# Patient Record
Sex: Male | Born: 1965 | Race: White | Hispanic: No | Marital: Married | Smoking: Former smoker
Health system: Southern US, Community
[De-identification: ages and names within clinical notes are randomized; demographics above are authoritative.]

## PROBLEM LIST (undated history)

## (undated) DIAGNOSIS — I10 Essential (primary) hypertension: Secondary | ICD-10-CM

## (undated) DIAGNOSIS — R7989 Other specified abnormal findings of blood chemistry: Secondary | ICD-10-CM

## (undated) DIAGNOSIS — K219 Gastro-esophageal reflux disease without esophagitis: Secondary | ICD-10-CM

## (undated) DIAGNOSIS — E119 Type 2 diabetes mellitus without complications: Secondary | ICD-10-CM

## (undated) DIAGNOSIS — E7800 Pure hypercholesterolemia, unspecified: Secondary | ICD-10-CM

## (undated) DIAGNOSIS — G4730 Sleep apnea, unspecified: Secondary | ICD-10-CM

## (undated) DIAGNOSIS — R12 Heartburn: Secondary | ICD-10-CM

## (undated) HISTORY — PX: COLONOSCOPY: SHX174

## (undated) HISTORY — PX: MULTIPLE TOOTH EXTRACTIONS: SHX2053

## (undated) HISTORY — PX: ANKLE ARTHROSCOPY: SUR85

## (undated) HISTORY — PX: DG GREAT TOE LEFT FOOT: HXRAD1656

## (undated) HISTORY — PX: BACK SURGERY: SHX140

---

## 2012-10-30 ENCOUNTER — Ambulatory Visit (INDEPENDENT_AMBULATORY_CARE_PROVIDER_SITE_OTHER): Payer: BC Managed Care – PPO | Admitting: Family Medicine

## 2012-10-30 VITALS — BP 131/90 | Ht 69.0 in | Wt 190.0 lb

## 2012-10-30 DIAGNOSIS — M722 Plantar fascial fibromatosis: Secondary | ICD-10-CM

## 2012-10-30 NOTE — Progress Notes (Signed)
Tanner Martinez is a 47 y.o. male who presents to [facility] today for right plantar fasciitis present for 6 months. Patient has previously been seen by his primary care provider who obtained x-rays showing mild he'll spurring. He has had a corticosteroid injection which was not effective. He previously was a runner running one half marathons frequently. In the last 6 months he has been unable to run due to heel pain. He notes right heel pain worse with activity and better with rest. He notes the pain is present with the first step of the day. He has tried some over-the-counter orthotic insoles which have been somewhat helpful.  No radiating pain weakness numbness fevers or chills   PMH reviewed. Healthy otherwise History  Substance Use Topics  . Smoking status: Not on file  . Smokeless tobacco: Not on file  . Alcohol Use: Not on file   retired ROS as above otherwise neg   Exam:  BP 131/90  Ht 5\' 9"  (1.753 m)  Wt 190 lb (86.183 kg)  BMI 28.05 kg/m2 Gen: Well NAD MSK: Right foot Normal-appearing Tender palpation over the medial plantar calcaneus Normal motion capillary refill sensation and strength Normal gait

## 2012-10-30 NOTE — Assessment & Plan Note (Signed)
Plantar fasciitis Plan: Ice massage Eccentric calf exercises Arch straps Heel cups Followup in 6 weeks

## 2012-10-30 NOTE — Patient Instructions (Addendum)
Thank you for coming in today. You have plantar fasciitis.  THis is a nagging problem that will take a while to get better.  1) Toe pulled back ice massage 2) Eccentic heel lifts 3 sets of 30 twice a day. Remember to go down slowly.  3) Grass barefoot walking.  4) Heel cups and arch straps.   Return in 6 weeks.

## 2012-11-25 ENCOUNTER — Ambulatory Visit: Payer: Self-pay | Admitting: Sports Medicine

## 2012-12-11 ENCOUNTER — Ambulatory Visit: Payer: Self-pay | Admitting: Family Medicine

## 2012-12-11 ENCOUNTER — Ambulatory Visit (INDEPENDENT_AMBULATORY_CARE_PROVIDER_SITE_OTHER): Payer: BC Managed Care – PPO | Admitting: Family Medicine

## 2012-12-11 VITALS — BP 120/70 | Ht 69.0 in | Wt 190.0 lb

## 2012-12-11 DIAGNOSIS — M722 Plantar fascial fibromatosis: Secondary | ICD-10-CM

## 2012-12-11 NOTE — Progress Notes (Signed)
  Subjective:    Patient ID: Tanner Martinez, male    DOB: 07-06-1965, 47 y.o.   MRN: [id number]  HPI  Followup right plantar fasciitis. They placed him in a heel cup and an arch strap both of which she used faithfully without any improvement.  To recap his issues: " right plantar fasciitis present for 6 months. Patient has previously been seen by his primary care provider who obtained x-rays showing mild heel spurring."    He has had a corticosteroid injection which was minimally effective. At the time of the injection he had severe pain and that did improve but the pain never total he went away and he has not been able to return to running.Tanner Martinez He previously was a runner running  half marathons frequently. In the last 6 months he has been unable to run due to heel pain. He notes right heel pain worse with activity and better with rest. He notes the pain is present with the first step of the day. He has tried some over-the-counter orthotic insoles which have been somewhat helpful.  No radiating pain weakness numbness fevers or chills   Review of Systems Denies unusual weight change. Denies foot numbness. Denies any color change of the skin of the feet.    Objective:   Physical Exam  Vital signs are reviewed GENERAL: Well-developed male no acute distress FOOT: Right. Tender to palpation at the origin of the plantar fascia. The medial calcaneal areas also mildly tender to palpation. No ecchymoses. VASCULAR: Dorsalis pedis pulses 2+ bilaterally equal. Skin: No erythema, no deformity, no callus. Normal Refill GAIT: Gait is antalgic today so it limits evaluation but he appears to walk with right toe out. I think she's putting excess strain on his plantar fascia as he proceeds through stance phase.      Assessment & Plan:  Unilateral plantar fasciitis. I'm hesitant to try any other steroid injections of the week a little better handle on the mechanics. Today I placed him in a green sports insult with  a scaphoid pad. I don't think he is getting enough arch support is just strap. I'll see him back in a week or so increased tolerating this okay think it would be prudent to proceed with a second corticosteroid injection to get rid of the chronic inflammation. Ultimately he may need custom molded orthotics.

## 2012-12-17 ENCOUNTER — Ambulatory Visit (INDEPENDENT_AMBULATORY_CARE_PROVIDER_SITE_OTHER): Payer: BC Managed Care – PPO | Admitting: Sports Medicine

## 2012-12-17 VITALS — BP 120/70 | Ht 69.0 in | Wt 193.0 lb

## 2012-12-17 DIAGNOSIS — M722 Plantar fascial fibromatosis: Secondary | ICD-10-CM

## 2012-12-18 NOTE — Progress Notes (Signed)
  Subjective:    Patient ID: Tanner Martinez, male    DOB: [birth year], 47 y.o.   MRN: [id number]  HPI Patient comes in today for followup on right foot plantar fasciitis. Was last seen by Dr. Jennette Kettle at her clinic last week. He was given a pair of green sports insoles with scaphoid pads but found them to be very uncomfortable. He has resumed using his more rigid off-the-shelf orthotic which he finds more comfortable. He is here today for consideration of a cortisone injection. He is leaving for a long trip up the [place] morning. He has had heel pain for several months. Has had 2 injections, the first one 6 months ago and the second one one month ago. First injection was quite helpful but the second one was less effective. He localizes his pain to the heel but at times radiates down the arch. Worse with first step. No swelling. Please see his previous office notes for further details regarding history.    Review of Systems     Objective:   Physical Exam Well-developed, fit-appearing. No acute distress. Awake alert and oriented x3. Vital signs are reviewed  Right foot: Patient has a cavus foot. He is tender to palpation at the calcaneal insertion of the plantar fascia. Negative calcaneal squeeze. No soft tissue swelling. Negative Tinel's over the tarsal tunnel. Good dorsalis pedis and posterior tibial pulses. Walks with a slight limp.  MSK ultrasound of the right heel: Limited scan of the plantar fascia was performed. It is definitely thickened at greater than 0.7cm. insertion shows several hypoechoic areas as well as a spur off the calcaneus. No cortical defect to suggest calcaneus stress fracture.       Assessment & Plan:  1. Right heel pain secondary to plantar fasciitis.  I've agreed to reinject the patient's plantar fascia today. This is his third injection and I explained to him that I would not be comfortable continuing with injections. Injection was performed using a medial  approach after risks and benefits were explained. He tolerated this without difficulty. I've given him a new pair of green sports insoles without the scaphoid pads. These will provide a little more cushioning in the more rigid inserts a he's been wearing. He will continue with icing and plantar fascia stretching as he has been doing. Followup in 4 weeks. He has had x-rays done previously but they are not available for review. If symptoms persist despite today's injection it would not be unreasonable to consider further diagnostic imaging before deciding on further treatment.  Consent obtained and verified. Time-out conducted. Noted no overlying erythema, induration, or other signs of local infection. Skin prepped in a sterile fashion. Topical analgesic spray: Ethyl chloride. Joint: right plantar fascia Needle: 25g 1 1/2 inch Completed without difficulty. Meds: 1cc (40mg ) depomedrol, 3cc 1% xylocaine  Advised to call if fevers/chills, erythema, induration, drainage, or persistent bleeding.

## 2013-01-14 ENCOUNTER — Ambulatory Visit: Payer: BC Managed Care – PPO | Admitting: Sports Medicine

## 2013-01-27 ENCOUNTER — Encounter: Payer: Self-pay | Admitting: Sports Medicine

## 2013-01-27 ENCOUNTER — Ambulatory Visit (INDEPENDENT_AMBULATORY_CARE_PROVIDER_SITE_OTHER): Payer: BC Managed Care – PPO | Admitting: Sports Medicine

## 2013-01-27 VITALS — BP 132/82 | HR 66 | Ht 69.0 in | Wt 193.0 lb

## 2013-01-27 DIAGNOSIS — M722 Plantar fascial fibromatosis: Secondary | ICD-10-CM

## 2013-01-27 NOTE — Progress Notes (Signed)
  Subjective:    Patient ID: Tanner Martinez, male    DOB: 13-Jan-1966, 47 y.o.   MRN: [id number]  HPI Patient comes in today for followup on right foot plantar fasciitis. I injected his plantar fascia during his last office visit a little over a month ago. Overall his symptoms have improved but not completely resolved. He continues to do his stretching but admits that he has not been icing the way he should. His symptoms are most noticeable with prolonged standing and walking. He has removed her green sports insoles as he found them uncomfortable. The symptoms have been present since December. MSK ultrasound performed at the last office visit showed his plantar fascia to be markedly enlarged at 0.7cm.   Review of Systems     Objective:   Physical Exam Well-developed, well-nourished. No acute distress  Right foot: Minimal tenderness to palpation at the calcaneal insertion of the plantar fascia. No soft tissue swelling. Negative calcaneal squeeze. Neurovascularly intact distally. Walking without significant limp.      Assessment & Plan:  Right heel pain secondary to plantar fasciitis  Overall his symptoms are improving. He has had a total of 3 injections. I explained to him that statistically speaking his symptoms are likely to burn out on their own. Right now his symptoms are tolerable. Continue with stretching and I've reinforced the importance of icing. We discussed the possibility of referral to Dr. Lajoyce Corners or Dr Victorino Dike if his symptoms persist. Followup PRN.

## 2013-07-08 ENCOUNTER — Encounter: Payer: Self-pay | Admitting: Sports Medicine

## 2013-07-08 ENCOUNTER — Ambulatory Visit (INDEPENDENT_AMBULATORY_CARE_PROVIDER_SITE_OTHER): Payer: BC Managed Care – PPO | Admitting: Sports Medicine

## 2013-07-08 VITALS — BP 120/80 | HR 72 | Ht 69.0 in | Wt 193.0 lb

## 2013-07-08 DIAGNOSIS — M722 Plantar fascial fibromatosis: Secondary | ICD-10-CM

## 2013-07-09 NOTE — Progress Notes (Signed)
   Subjective:    Patient ID: Tanner Martinez, male    DOB: 06/08/1965, 48 y.o.   MRN: [id number]  HPI Patient comes in today with returning right foot pain. He has a well-documented history of plantar fasciitis in this foot. He's had multiple cortisone injections, last one being this past summer. The injection provided him with positive yet only limited symptom relief. He reinjured his foot while doing some impact type of exercises. His pain is now along the lateral and plantar aspect of his foot. We tried a green sports insole and a scaphoid pad previously but he did not tolerate it. Instead, he was using a more rigid insert which she found more comfortable. Today, he is not wearing any sort of an insert. He is doing his plantar fascial stretches but is not doing any icing. He is here today discuss further treatment. Symptoms have been present now for a little over a year.    Review of Systems     Objective:   Physical Exam Well-developed, well-nourished. No acute distress. Awake alert and oriented x3  Right foot: Cavus foot. Tenderness to palpation along the mid substance of the plantar fascia. Only male tenderness at the calcaneal insertion. Negative calcaneal squeeze. No soft tissue swelling. Neurovascular intact distally. Walking with a slight limp.  MSK ultrasound of the right plantar fascia shows it to be thickened. There is nicotine hypoechoic change throughout the proximal plantar fascia. Calcaneal spurs are seen as well.      Assessment & Plan:  Chronic right foot pain secondary to plantar fasciitis  I discussed the patient's options with him including trying custom orthotics, continuing watchful waiting, and surgical referral. Patient is not interested at this time in anything surgical. I do think we should try to get him into a custom orthotic as he has a rather pronounced cavus foot. He will check with his insurance prior to us instructing knees. Followup appointment for next week.  In the meantime, I've given him a new set of arch straps and I've reiterated the importance of plantar fascial stretching and daily icing.

## 2013-07-15 ENCOUNTER — Ambulatory Visit: Payer: BC Managed Care – PPO | Admitting: Sports Medicine

## 2013-07-22 ENCOUNTER — Encounter: Payer: Self-pay | Admitting: Sports Medicine

## 2013-07-22 ENCOUNTER — Ambulatory Visit (INDEPENDENT_AMBULATORY_CARE_PROVIDER_SITE_OTHER): Payer: BC Managed Care – PPO | Admitting: Sports Medicine

## 2013-07-22 VITALS — BP 128/85 | HR 62 | Ht 69.0 in | Wt 193.0 lb

## 2013-07-22 DIAGNOSIS — M216X9 Other acquired deformities of unspecified foot: Secondary | ICD-10-CM

## 2013-07-22 DIAGNOSIS — M722 Plantar fascial fibromatosis: Secondary | ICD-10-CM

## 2013-07-22 DIAGNOSIS — Q667 Congenital pes cavus, unspecified foot: Secondary | ICD-10-CM

## 2013-07-22 NOTE — Progress Notes (Signed)
Patient ID: Marney DoctorJody Martinez, male   DOB: 07/08/1965, 48 y.o.   MRN: [id number]  Patient comes in today for custom orthotics. He has a well-documented history of chronic plantar fasciitis. Please see previous office notes for details regarding history and physical.  Patient was fitted for a : standard, cushioned, semi-rigid orthotic. The orthotic was heated and afterward the patient stood on the orthotic blank positioned on the orthotic stand. The patient was positioned in subtalar neutral position and 10 degrees of ankle dorsiflexion in a weight bearing stance. After completion of molding, a stable base was applied to the orthotic blank. The blank was ground to a stable position for weight bearing. Size:11 Base: Blue EVA Posting:none Additional orthotic padding:none  Total time spent with the patient was 30 minutes with greater than 50% of that time spent in face to face consultation regarding orthotic evaluation, construction, and fitting. F/U PRN

## 2014-11-23 ENCOUNTER — Encounter: Payer: Self-pay | Admitting: Sports Medicine

## 2014-11-23 ENCOUNTER — Ambulatory Visit (INDEPENDENT_AMBULATORY_CARE_PROVIDER_SITE_OTHER): Payer: BLUE CROSS/BLUE SHIELD | Admitting: Sports Medicine

## 2014-11-23 VITALS — BP 146/104 | Ht 69.0 in | Wt 210.0 lb

## 2014-11-23 DIAGNOSIS — M25572 Pain in left ankle and joints of left foot: Secondary | ICD-10-CM

## 2014-11-23 DIAGNOSIS — S93402A Sprain of unspecified ligament of left ankle, initial encounter: Secondary | ICD-10-CM | POA: Diagnosis not present

## 2014-11-23 DIAGNOSIS — M722 Plantar fascial fibromatosis: Secondary | ICD-10-CM

## 2014-11-24 NOTE — Progress Notes (Signed)
   Subjective:    Patient ID: Tanner Martinez, male    DOB: 03/14/1966, 49 y.o.   MRN: [id number]  HPI chief complaint: Bilateral foot pain and left ankle pain  Very pleasant 49 year old male comes in today complaining of returning bilateral foot pain. He has a history of chronic plantar fasciitis and his pain is identical to what he has experienced previously. He has had intermittent symptoms for about 3 years. He has had exhaustive conservative treatment including cortisone injections, orthotics, night splinting, physical therapy, and medications. He is also complaining of a new injury to the left ankle. He suffered a rather significant inversion injury a few weeks ago while training for half marathon. Was initially seen by a physician and x-rays were negative for fracture per his report. He was initially placed into a splint which he has weaned from himself. He was able to run the half marathon. He has continued to have pain and weakness in his ankle. He localizes all of his pain to the lateral ankle. He has noticed some swelling as well. Denies significant injury to the ankle in the past. He is currently using meloxicam as well as Voltaren gel for some left elbow lateral epicondylitis but it has not improved his foot or ankle pain.  Interim medical history reviewed Medications reviewed No known drug allergies    Review of Systems    as above Objective:   Physical Exam Well-developed, well-nourished. No acute distress.  Patient has slight tenderness to palpation at the origin of the plantar fascia bilaterally. Negative calcaneal squeeze. Cavus foot.  Left ankle: Full range of motion with mild diffuse swelling. Some tenderness to palpation over the ATF. Positive anterior drawer, 2+ talar tilt. No tenderness over the medial malleolus. No tenderness at the base of the fifth metatarsal nor over the navicular. Neurovascular intact distally. Walking without significant limp.       Assessment &  Plan:  Chronic bilateral foot pain secondary to plantar fasciitis Left ankle pain secondary to ankle sprain  Patient's plantar fasciitis has been present for about 3 years. Despite this he is not interested in operative intervention. He would be interested in trying some high-frequency ultrasound which I think has some limited evidence for treatment. I will research this a little further for him and contact him once I have more information. For his left ankle sprain I've given him a comprehensive home exercise program including strengthening and proprioception exercises. Also given him a body helix compression sleeve with activity. I think he can continue with activity as tolerated in regards to both his plantar fasciitis and his left ankle sprain. If his ankle pain persists he will let me know and I would consider further diagnostic imaging.

## 2015-04-18 ENCOUNTER — Encounter: Payer: Self-pay | Admitting: Sports Medicine

## 2015-04-18 ENCOUNTER — Ambulatory Visit (INDEPENDENT_AMBULATORY_CARE_PROVIDER_SITE_OTHER): Payer: BLUE CROSS/BLUE SHIELD | Admitting: Sports Medicine

## 2015-04-18 VITALS — BP 128/70 | Ht 69.0 in | Wt 215.0 lb

## 2015-04-18 DIAGNOSIS — M25572 Pain in left ankle and joints of left foot: Secondary | ICD-10-CM

## 2015-04-18 NOTE — Assessment & Plan Note (Deleted)
Patient with persistent ankle pain. X-rays reviewed and not significant for likely precipitant of pain. Concern for osteochondral defect. Will obtain MRI of left ankle to better evaluate.

## 2015-04-18 NOTE — Progress Notes (Signed)
   Subjective:    Patient ID: Tanner Martinez, male    DOB: 12/07/1965, 49 y.o.   MRN: [id number]  HPI Patient presents for follow-up of his ankle injury. His injury first occurred 8 months ago. Since his last visit 4 months ago, he reports his pain has largely remained unchanged. He has been participating in physical therapy which has not improved his pain much. Walking, running going up/down stairs aggravate his pain. Pain is not constant. He describes the pain as sharp pain that starts on the dorsal aspect of his ankle and radiates medially down his ankle. He reports continued swelling but has not been icing his ankle. He has not re injured his ankle.   Review of Systems  Musculoskeletal:       Ankle pain       Objective:  BP 128/70 mmHg  Ht 5\' 9"  (1.753 m)  Wt 215 lb (97.523 kg)  BMI 31.74 kg/m2   Physical Exam  Constitutional: He appears well-developed.  Left ankle: Limited dorsiflexion of the left ankle by about 50% when compared to the uninvolved right ankle. Full plantar flexion. Good inversion and eversion. Trace effusion. No soft tissue swelling. No tenderness to palpation along the peroneal tendon. No appreciable subluxation of the peroneal tendon. No tenderness to palpation over the distal fibula or medial malleolus. No tenderness along the anterior lateral joint line. Neurovascularly intact distally. Walks with a slight limp.  X-rays from an outside source from May of the sheer are reviewed. No obvious fracture seen. There is a small well-corticated calcification distal to the tibia which may be consistent with an old injury versus a small accessory bone.. There is also a small os trigonum.       Assessment & Plan:  Persistent left ankle pain and swelling status post inversion injury  I'm concerned about a possible OCD versus an occult talus fracture. There is also the possibility of a intra-articular loose body. In order to better differentiate we will need to get an MRI. I  will call him with those results once available at which point we will delineate further treatment.

## 2015-04-24 ENCOUNTER — Ambulatory Visit: Payer: BLUE CROSS/BLUE SHIELD | Admitting: Sports Medicine

## 2015-04-27 ENCOUNTER — Ambulatory Visit
Admission: RE | Admit: 2015-04-27 | Discharge: 2015-04-27 | Disposition: A | Discharge status: Home / Self Care | Payer: BLUE CROSS/BLUE SHIELD | Source: Ambulatory Visit | Attending: Sports Medicine | Admitting: Sports Medicine

## 2015-04-27 DIAGNOSIS — M25572 Pain in left ankle and joints of left foot: Secondary | ICD-10-CM

## 2015-05-02 ENCOUNTER — Telehealth: Payer: Self-pay | Admitting: Sports Medicine

## 2015-05-02 DIAGNOSIS — M25572 Pain in left ankle and joints of left foot: Secondary | ICD-10-CM

## 2015-05-02 NOTE — Telephone Encounter (Signed)
I spoke with the patient on the phone today after reviewing the MRI of his left ankle. He does have a small osteochondral defect with some surrounding marrow edema. He also has synovitis as well as some thickening of an accessory anterior inferior tibiofibular ligament which may result in anterior lateral ankle impingement. Based on this constellation of findings I recommended a referral to Dr. Lajoyce Cornersuda to discuss further treatment. Patient is in agreement with this plan. I will defer further workup and treatment to his discretion and the patient will follow-up with me as needed.

## 2015-05-02 NOTE — Telephone Encounter (Signed)
[facility] Dr Aldean BakerMarcus Duda Friday December 16th at 9a [address], [place], [place] [address] Phone: [phone]

## 2015-05-02 NOTE — Addendum Note (Signed)
Addended by: Annita BrodMOORE, Roshawn Lacina C on: 05/02/2015 04:13 PM   Modules accepted: Orders

## 2015-06-29 ENCOUNTER — Encounter (HOSPITAL_COMMUNITY): Payer: Self-pay | Admitting: *Deleted

## 2015-06-29 ENCOUNTER — Other Ambulatory Visit (HOSPITAL_COMMUNITY): Payer: Self-pay | Admitting: Orthopedic Surgery

## 2015-06-29 NOTE — Progress Notes (Signed)
Pt denies SOB, chest pain, and being under the care of a cardiologist. Pt denies having a stress test, echo and cardiac cath. Pt denies having a chest x ray and EKG within the last year. Pt made aware to stop  taking Aspirin, vitamins, fish oil and herbal medications. Do not take any NSAIDs ie: Ibuprofen, Advil, Naproxen or any medication containing Aspirin such as Voltaren and Mobic. Pt made aware of diabetes protocol to check BS in the AM, interventions for BS < 70 >220 and to call SS morning of procedure with any issues. Pt was instructed to arrive at 12:45 however, pt stated that he was instructed by MD's office to arrive at 12:00 noon. Pt verbalized understanding of all pre-op instructions.

## 2015-06-30 ENCOUNTER — Ambulatory Visit (HOSPITAL_COMMUNITY): Payer: BLUE CROSS/BLUE SHIELD | Admitting: Anesthesiology

## 2015-06-30 ENCOUNTER — Encounter (HOSPITAL_COMMUNITY)
Admission: RE | Disposition: A | Discharge status: Home / Self Care | Payer: Self-pay | Source: Ambulatory Visit | Attending: Orthopedic Surgery

## 2015-06-30 ENCOUNTER — Encounter (HOSPITAL_COMMUNITY): Payer: Self-pay

## 2015-06-30 ENCOUNTER — Ambulatory Visit (HOSPITAL_COMMUNITY)
Admission: RE | Admit: 2015-06-30 | Discharge: 2015-06-30 | Disposition: A | Discharge status: Home / Self Care | Payer: BLUE CROSS/BLUE SHIELD | Source: Ambulatory Visit | Attending: Orthopedic Surgery | Admitting: Orthopedic Surgery

## 2015-06-30 DIAGNOSIS — E119 Type 2 diabetes mellitus without complications: Secondary | ICD-10-CM | POA: Insufficient documentation

## 2015-06-30 DIAGNOSIS — G4733 Obstructive sleep apnea (adult) (pediatric): Secondary | ICD-10-CM | POA: Diagnosis not present

## 2015-06-30 DIAGNOSIS — M669 Spontaneous rupture of unspecified tendon: Secondary | ICD-10-CM

## 2015-06-30 DIAGNOSIS — M6620 Spontaneous rupture of extensor tendons, unspecified site: Secondary | ICD-10-CM

## 2015-06-30 DIAGNOSIS — X58XXXA Exposure to other specified factors, initial encounter: Secondary | ICD-10-CM | POA: Diagnosis not present

## 2015-06-30 DIAGNOSIS — I10 Essential (primary) hypertension: Secondary | ICD-10-CM | POA: Insufficient documentation

## 2015-06-30 DIAGNOSIS — S96112A Strain of muscle and tendon of long extensor muscle of toe at ankle and foot level, left foot, initial encounter: Secondary | ICD-10-CM | POA: Insufficient documentation

## 2015-06-30 DIAGNOSIS — Z87891 Personal history of nicotine dependence: Secondary | ICD-10-CM | POA: Diagnosis not present

## 2015-06-30 HISTORY — DX: Essential (primary) hypertension: I10

## 2015-06-30 HISTORY — PX: REPAIR EXTENSOR TENDON: SHX5382

## 2015-06-30 HISTORY — DX: Type 2 diabetes mellitus without complications: E11.9

## 2015-06-30 HISTORY — DX: Heartburn: R12

## 2015-06-30 HISTORY — DX: Sleep apnea, unspecified: G47.30

## 2015-06-30 HISTORY — DX: Pure hypercholesterolemia, unspecified: E78.00

## 2015-06-30 LAB — CBC
HEMATOCRIT: 44.9 % (ref 39.0–52.0)
HEMOGLOBIN: 15.5 g/dL (ref 13.0–17.0)
MCH: 32.1 pg (ref 26.0–34.0)
MCHC: 34.5 g/dL (ref 30.0–36.0)
MCV: 93 fL (ref 78.0–100.0)
Platelets: 205 10*3/uL (ref 150–400)
RBC: 4.83 MIL/uL (ref 4.22–5.81)
RDW: 12.6 % (ref 11.5–15.5)
WBC: 5.8 10*3/uL (ref 4.0–10.5)

## 2015-06-30 LAB — APTT: aPTT: 26 seconds (ref 24–37)

## 2015-06-30 LAB — COMPREHENSIVE METABOLIC PANEL
ALBUMIN: 3.6 g/dL (ref 3.5–5.0)
ALT: 57 U/L (ref 17–63)
ANION GAP: 9 (ref 5–15)
AST: 33 U/L (ref 15–41)
Alkaline Phosphatase: 62 U/L (ref 38–126)
BUN: 13 mg/dL (ref 6–20)
CHLORIDE: 107 mmol/L (ref 101–111)
CO2: 23 mmol/L (ref 22–32)
Calcium: 9 mg/dL (ref 8.9–10.3)
Creatinine, Ser: 0.87 mg/dL (ref 0.61–1.24)
GFR calc Af Amer: >60 mL/min (ref >60)
Glucose, Bld: 138 mg/dL — ABNORMAL HIGH (ref 65–99)
POTASSIUM: 4.1 mmol/L (ref 3.5–5.1)
Sodium: 139 mmol/L (ref 135–145)
Total Bilirubin: 0.6 mg/dL (ref 0.3–1.2)
Total Protein: 6.9 g/dL (ref 6.5–8.1)

## 2015-06-30 LAB — GLUCOSE, CAPILLARY
GLUCOSE-CAPILLARY: 132 mg/dL — AB (ref 65–99)
Glucose-Capillary: 128 mg/dL — ABNORMAL HIGH (ref 65–99)

## 2015-06-30 LAB — PROTIME-INR
INR: 1.06 (ref 0.00–1.49)
PROTHROMBIN TIME: 14 s (ref 11.6–15.2)

## 2015-06-30 SURGERY — REPAIR, TENDON, EXTENSOR
Anesthesia: General | Laterality: Left

## 2015-06-30 MED ORDER — ONDANSETRON HCL 4 MG/2ML IJ SOLN
INTRAMUSCULAR | Status: DC | PRN
  Administered 2015-06-30: 4 mg via INTRAVENOUS

## 2015-06-30 MED ORDER — HYDROMORPHONE HCL 1 MG/ML IJ SOLN
0.2500 mg | INTRAMUSCULAR | Status: DC | PRN
  Administered 2015-06-30 (×4): 0.5 mg via INTRAVENOUS

## 2015-06-30 MED ORDER — PROPOFOL 10 MG/ML IV BOLUS
INTRAVENOUS | Status: AC
  Filled 2015-06-30: qty 20

## 2015-06-30 MED ORDER — HYDROMORPHONE HCL 1 MG/ML IJ SOLN
INTRAMUSCULAR | Status: AC
  Filled 2015-06-30: qty 1

## 2015-06-30 MED ORDER — LACTATED RINGERS IV SOLN
INTRAVENOUS | Status: DC
  Administered 2015-06-30: 13:00:00 via INTRAVENOUS

## 2015-06-30 MED ORDER — FENTANYL CITRATE (PF) 250 MCG/5ML IJ SOLN
INTRAMUSCULAR | Status: AC
  Filled 2015-06-30: qty 5

## 2015-06-30 MED ORDER — ONDANSETRON HCL 4 MG/2ML IJ SOLN
INTRAMUSCULAR | Status: AC
  Filled 2015-06-30: qty 4

## 2015-06-30 MED ORDER — CEFAZOLIN SODIUM-DEXTROSE 2-3 GM-% IV SOLR
2.0000 g | INTRAVENOUS | Status: AC
  Administered 2015-06-30: 2 g via INTRAVENOUS
  Filled 2015-06-30: qty 50

## 2015-06-30 MED ORDER — 0.9 % SODIUM CHLORIDE (POUR BTL) OPTIME
TOPICAL | Status: DC | PRN
  Administered 2015-06-30: 1000 mL

## 2015-06-30 MED ORDER — PROMETHAZINE HCL 25 MG/ML IJ SOLN: 6.2500 mg | INTRAMUSCULAR | Status: DC | PRN

## 2015-06-30 MED ORDER — LIDOCAINE HCL (CARDIAC) 20 MG/ML IV SOLN
INTRAVENOUS | Status: DC | PRN
  Administered 2015-06-30: 100 mg via INTRAVENOUS

## 2015-06-30 MED ORDER — PROPOFOL 10 MG/ML IV BOLUS
INTRAVENOUS | Status: DC | PRN
  Administered 2015-06-30: 200 mg via INTRAVENOUS

## 2015-06-30 MED ORDER — MIDAZOLAM HCL 5 MG/5ML IJ SOLN
INTRAMUSCULAR | Status: DC | PRN
  Administered 2015-06-30: 2 mg via INTRAVENOUS

## 2015-06-30 MED ORDER — CHLORHEXIDINE GLUCONATE 4 % EX LIQD: 60.0000 mL | Freq: Once | CUTANEOUS | Status: DC

## 2015-06-30 MED ORDER — MIDAZOLAM HCL 2 MG/2ML IJ SOLN
INTRAMUSCULAR | Status: AC
  Filled 2015-06-30: qty 2

## 2015-06-30 MED ORDER — FENTANYL CITRATE (PF) 100 MCG/2ML IJ SOLN
INTRAMUSCULAR | Status: DC | PRN
  Administered 2015-06-30: 100 ug via INTRAVENOUS
  Administered 2015-06-30: 50 ug via INTRAVENOUS
  Administered 2015-06-30: 100 ug via INTRAVENOUS
  Administered 2015-06-30 (×2): 50 ug via INTRAVENOUS
  Administered 2015-06-30: 150 ug via INTRAVENOUS

## 2015-06-30 SURGICAL SUPPLY — 46 items
BLADE SURG 10 STRL SS (BLADE) ×2 IMPLANT
BNDG COHESIVE 4X5 TAN STRL (GAUZE/BANDAGES/DRESSINGS) ×2 IMPLANT
BNDG COHESIVE 6X5 TAN STRL LF (GAUZE/BANDAGES/DRESSINGS) ×4 IMPLANT
BNDG ESMARK 4X9 LF (GAUZE/BANDAGES/DRESSINGS) ×2 IMPLANT
BNDG GAUZE STRTCH 6 (GAUZE/BANDAGES/DRESSINGS) ×2 IMPLANT
COTTON STERILE ROLL (GAUZE/BANDAGES/DRESSINGS) ×2 IMPLANT
COVER SURGICAL LIGHT HANDLE (MISCELLANEOUS) ×2 IMPLANT
CUFF TOURNIQUET SINGLE 34IN LL (TOURNIQUET CUFF) IMPLANT
CUFF TOURNIQUET SINGLE 44IN (TOURNIQUET CUFF) IMPLANT
DRAPE INCISE IOBAN 66X45 STRL (DRAPES) ×2 IMPLANT
DRAPE U-SHAPE 47X51 STRL (DRAPES) ×2 IMPLANT
DRSG ADAPTIC 3X8 NADH LF (GAUZE/BANDAGES/DRESSINGS) ×2 IMPLANT
DRSG KUZMA FLUFF (GAUZE/BANDAGES/DRESSINGS) ×2 IMPLANT
DRSG PAD ABDOMINAL 8X10 ST (GAUZE/BANDAGES/DRESSINGS) ×4 IMPLANT
DURAPREP 26ML APPLICATOR (WOUND CARE) ×2 IMPLANT
ELECT REM PT RETURN 9FT ADLT (ELECTROSURGICAL) ×2
ELECTRODE REM PT RTRN 9FT ADLT (ELECTROSURGICAL) ×1 IMPLANT
GAUZE SPONGE 4X4 12PLY STRL (GAUZE/BANDAGES/DRESSINGS) ×2 IMPLANT
GLOVE BIOGEL PI IND STRL 9 (GLOVE) ×1 IMPLANT
GLOVE BIOGEL PI INDICATOR 9 (GLOVE) ×1
GLOVE SURG ORTHO 9.0 STRL STRW (GLOVE) ×2 IMPLANT
GOWN STRL REUS W/ TWL XL LVL3 (GOWN DISPOSABLE) ×3 IMPLANT
GOWN STRL REUS W/TWL XL LVL3 (GOWN DISPOSABLE) ×3
KIT ROOM TURNOVER OR (KITS) ×2 IMPLANT
MANIFOLD NEPTUNE II (INSTRUMENTS) IMPLANT
NDL SUT .5 MAYO 1.404X.05X (NEEDLE) ×1 IMPLANT
NEEDLE MAYO TAPER (NEEDLE) ×1
NS IRRIG 1000ML POUR BTL (IV SOLUTION) ×2 IMPLANT
PACK ORTHO EXTREMITY (CUSTOM PROCEDURE TRAY) ×2 IMPLANT
PAD ARMBOARD 7.5X6 YLW CONV (MISCELLANEOUS) IMPLANT
SPONGE GAUZE 4X4 12PLY STER LF (GAUZE/BANDAGES/DRESSINGS) ×4 IMPLANT
SPONGE LAP 4X18 X RAY DECT (DISPOSABLE) IMPLANT
SUCTION FRAZIER HANDLE 10FR (MISCELLANEOUS) ×1
SUCTION TUBE FRAZIER 10FR DISP (MISCELLANEOUS) ×1 IMPLANT
SUT ETHILON 3 0 FSLX (SUTURE) ×2 IMPLANT
SUT FIBERWIRE #2 38 T-5 BLUE (SUTURE) ×4
SUT MNCRL AB 3-0 PS2 18 (SUTURE) IMPLANT
SUT VIC AB 2-0 CT1 27 (SUTURE) ×2
SUT VIC AB 2-0 CT1 TAPERPNT 27 (SUTURE) ×2 IMPLANT
SUTURE FIBERWR #2 38 T-5 BLUE (SUTURE) ×2 IMPLANT
TOWEL OR 17X24 6PK STRL BLUE (TOWEL DISPOSABLE) ×2 IMPLANT
TOWEL OR 17X26 10 PK STRL BLUE (TOWEL DISPOSABLE) ×2 IMPLANT
TRACKER EX WALKER, CLOSED HEEL, WIDE FOOT PLATE, LARGE ×2 IMPLANT
TUBE CONNECTING 12X1/4 (SUCTIONS) ×4 IMPLANT
WATER STERILE IRR 1000ML POUR (IV SOLUTION) IMPLANT
YANKAUER SUCT BULB TIP NO VENT (SUCTIONS) ×2 IMPLANT

## 2015-06-30 NOTE — Op Note (Signed)
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    The risks benefits and alternatives were discussed with the patient preoperatively including but not limited to the risks of infection, bleeding, nerve injury, cardiopulmonary complications, the need for revision surgery, among others, and the patient was willing to proceed.  OPERATIVE IMPLANTS: #2 FiberWire  OPERATIVE FINDINGS: Chronic retracted EHL tendon  OPERATIVE PROCEDURE: Patient brought the operating room and underwent a general anesthetic. After adequate levels anesthesia obtained patient's left lower extremity was prepped using DuraPrep draped into a sterile field a timeout was called. A dorsal incision was made over the retinaculum of the ankle. Blunt dissection was carried down to the retinaculum which was incised. The most distal aspect of the tendon for the EHL was identified the aspect of the tendon that was adjacent to the capsule  was intact the most dorsal aspect of the tendon showed degenerative changes. The tendon was approximately 1 cm away from both the anterior medial and anterior lateral portals did not appear to be injured by the portal location and did not appear to be injured by the debridement within the capsule since the capsule side of the tendon was intact. The most proximal aspect of the EHL tendon was identified and this had retracted. The end of the tendon was debrided. Using a #2 FiberWire this was woven with a Kessler technique to the proximal and distal stumps  and the tendon was repaired this required the great toe to be extended. The wound was irrigated with normal saline. The skin was closed using 2-0 nylon a sterile compressive dressing was applied. Patient was placed in a fracture boot with padding under the great toe to protect the repair. The repair was reinforced with 4 sutures proximally and 4 sutures distally crossing the repair site. Patient was extubated taken to the PACU in stable condition.

## 2015-06-30 NOTE — Anesthesia Procedure Notes (Signed)
Procedure Name: LMA Insertion Date/Time: 06/30/2015 3:17 PM Performed by: Charm Barges, Aneliese Beaudry R Pre-anesthesia Checklist: Patient identified, Emergency Drugs available, Suction available, Patient being monitored and Timeout performed Patient Re-evaluated:Patient Re-evaluated prior to inductionOxygen Delivery Method: Circle system utilized Preoxygenation: Pre-oxygenation with 100% oxygen Intubation Type: IV induction Ventilation: Mask ventilation without difficulty LMA: LMA inserted LMA Size: 5.0 Number of attempts: 1 Placement Confirmation: positive ETCO2 Tube secured with: Tape Dental Injury: Teeth and Oropharynx as per pre-operative assessment

## 2015-06-30 NOTE — Transfer of Care (Signed)
Immediate Anesthesia Transfer of Care Note  Patient: Tanner Martinez  Procedure(s) Performed: Procedure(s): Repair Extensor Hallucis Longus Left Great Toe (Left)  Patient Location: PACU  Anesthesia Type:General  Level of Consciousness: awake, oriented and patient cooperative  Airway & Oxygen Therapy: Patient Spontanous Breathing and Patient connected to nasal cannula oxygen  Post-op Assessment: Report given to RN, Post -op Vital signs reviewed and stable and Patient moving all extremities  Post vital signs: Reviewed and stable  Last Vitals:  Filed Vitals:   06/30/15 1225  BP: 131/97  Pulse: 71  Temp: 36.8 C  Resp: 18    Complications: No apparent anesthesia complications

## 2015-06-30 NOTE — Progress Notes (Signed)
Orthopedic Tech Progress Note Patient Details:  Tanner Martinez 24-Feb-1966 [id number] To be applied in or Ortho Devices Type of Ortho Device: CAM walker Ortho Device/Splint Interventions: Casandra Doffing 06/30/2015, 4:15 PM

## 2015-06-30 NOTE — Anesthesia Postprocedure Evaluation (Signed)
Anesthesia Post Note  Patient: Tanner Martinez  Procedure(s) Performed: Procedure(s) (LRB): Repair Extensor Hallucis Longus Left Great Toe (Left)  Patient location during evaluation: PACU Anesthesia Type: General Level of consciousness: awake and alert Pain management: pain level controlled Vital Signs Assessment: post-procedure vital signs reviewed and stable Respiratory status: spontaneous breathing, nonlabored ventilation, respiratory function stable and patient connected to nasal cannula oxygen Cardiovascular status: blood pressure returned to baseline and stable Postop Assessment: no signs of nausea or vomiting Anesthetic complications: no    Last Vitals:  Filed Vitals:   06/30/15 1657 06/30/15 1700  BP: 124/92   Pulse: 90 88  Temp:    Resp: 14 14    Last Pain:  Filed Vitals:   06/30/15 1706  PainSc: 7                  Daylah Sayavong L

## 2015-06-30 NOTE — H&P (Signed)
Tanner Martinez is an 50 y.o. male.   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 Postoperatively patient has had difficulty with EHL function. We'll plan for exploration debridement of adhesions possible repair of the EHL tendon.  Past Medical History  Diagnosis Date  . Diabetes mellitus without complication (HCC)     diet controlled  . Hypertension     past medical history ; no longer on medications  . Hypercholesterolemia   . Sleep apnea     " slight case don't wear CPAP or Bipap"  . Heartburn     Past Surgical History  Procedure Laterality Date  . Back surgery    . Ankle arthroscopy    . Multiple tooth extractions      Family History  Problem Relation Age of Onset  . Diabetes Mother   . Diabetes Sister    Social History:  reports that he quit smoking about 13 years ago. His smoking use included Cigarettes. He quit smokeless tobacco use about 9 years ago. He reports that he drinks alcohol. He reports that he does not use illicit drugs.  Allergies: No Known Allergies  No prescriptions prior to admission    No results found for this or any previous visit (from the past 48 hour(s)). No results found.  Review of Systems  All other systems reviewed and are negative.   There were no vitals taken for this visit. Physical Exam  On examination patient does not have active extension of his left great toe. Palpation shows laxity of the extensor hallucis longus tendon. 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  Plan: We will plan for debridement of adhesions possible reconstruction of the EHL tendon. Risk and benefits were discussed patient states he understands wishes to proceed at this time.  Nadara Mustard, MD 06/30/2015, 7:05 AM

## 2015-06-30 NOTE — Anesthesia Preprocedure Evaluation (Addendum)
Anesthesia Evaluation  Patient identified by MRN, date of birth, ID band Patient awake    Reviewed: Allergy & Precautions, H&P , NPO status , Patient's Chart, lab work & pertinent test results, reviewed documented beta blocker date and time   Airway Mallampati: II  TM Distance: >3 FB Neck ROM: Full    Dental no notable dental hx. (+) Dental Advisory Given, Teeth Intact   Pulmonary sleep apnea , former smoker,  Mild OSA   Pulmonary exam normal breath sounds clear to auscultation       Cardiovascular Exercise Tolerance: Good hypertension, Pt. on medications negative cardio ROS Normal cardiovascular exam Rhythm:Regular Rate:Normal     Neuro/Psych negative neurological ROS  negative psych ROS   GI/Hepatic negative GI ROS, Neg liver ROS,   Endo/Other  diabetes, Well Controlled, Type 2Diet controlled DM  Renal/GU negative Renal ROS  negative genitourinary   Musculoskeletal negative musculoskeletal ROS (+)   Abdominal   Peds negative pediatric ROS (+)  Hematology negative hematology ROS (+)   Anesthesia Other Findings   Reproductive/Obstetrics negative OB ROS                            Anesthesia Physical Anesthesia Plan  ASA: II  Anesthesia Plan: General   Post-op Pain Management:    Induction: Intravenous  Airway Management Planned: LMA  Additional Equipment:   Intra-op Plan:   Post-operative Plan: Extubation in OR  Informed Consent: I have reviewed the patients History and Physical, chart, labs and discussed the procedure including the risks, benefits and alternatives for the proposed anesthesia with the patient or authorized representative who has indicated his/her understanding and acceptance.   Dental advisory given  Plan Discussed with: CRNA and Surgeon  Anesthesia Plan Comments:         Anesthesia Quick Evaluation

## 2015-07-04 ENCOUNTER — Encounter (HOSPITAL_COMMUNITY): Payer: Self-pay | Admitting: Orthopedic Surgery

## 2016-12-24 ENCOUNTER — Encounter: Payer: Self-pay | Admitting: Family Medicine

## 2017-09-22 IMAGING — MR MR ANKLE*L* W/O CM
3 of 5 series · 8 of 40 positions shown · non-contrast
Comparison: None.

CLINICAL DATA: Medial and lateral ankle pain since August 2014.
Twisting injury.

EXAM:
MRI OF THE LEFT ANKLE WITHOUT CONTRAST
TECHNIQUE: Multiplanar, multisequence MR imaging of the ankle was performed. No
intravenous contrast was administered.

[Series 8: T2 fat-sat · sagittal · left · 3.0mm · 0.21mm/px · 3 of 23 slices shown (1 of 2)]
[im 4/23]
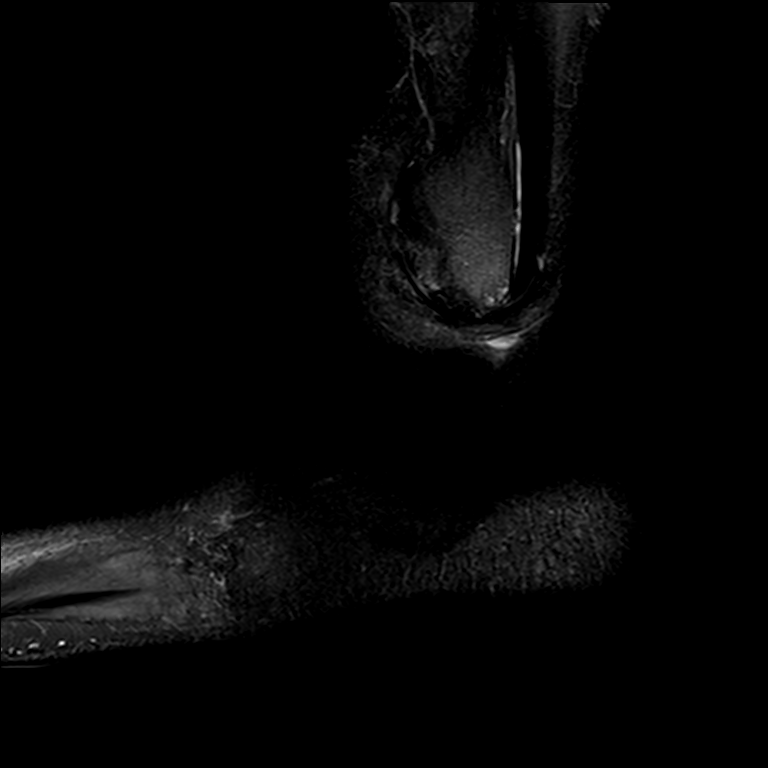
[im 12/23]
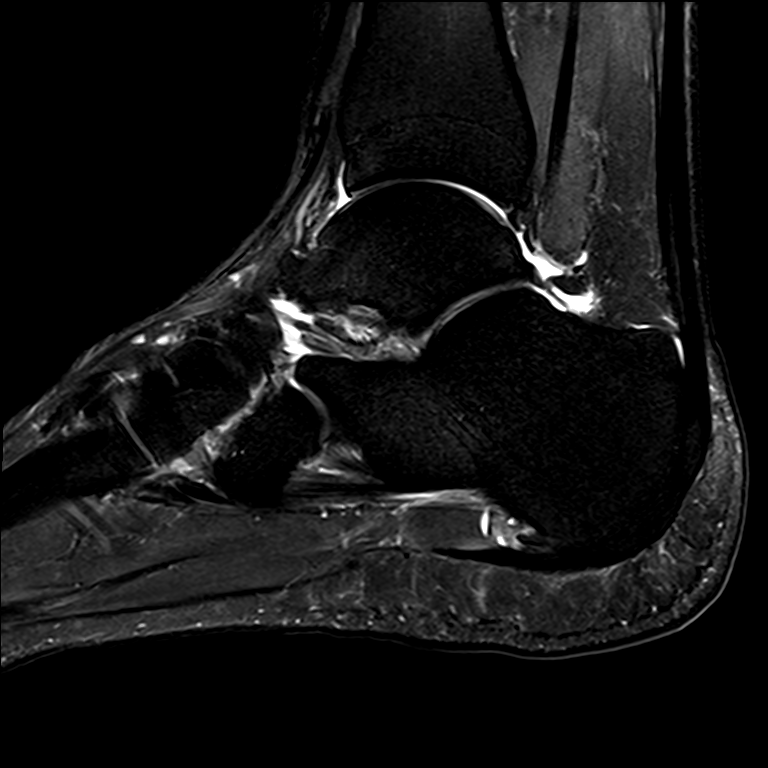
[im 19/23]
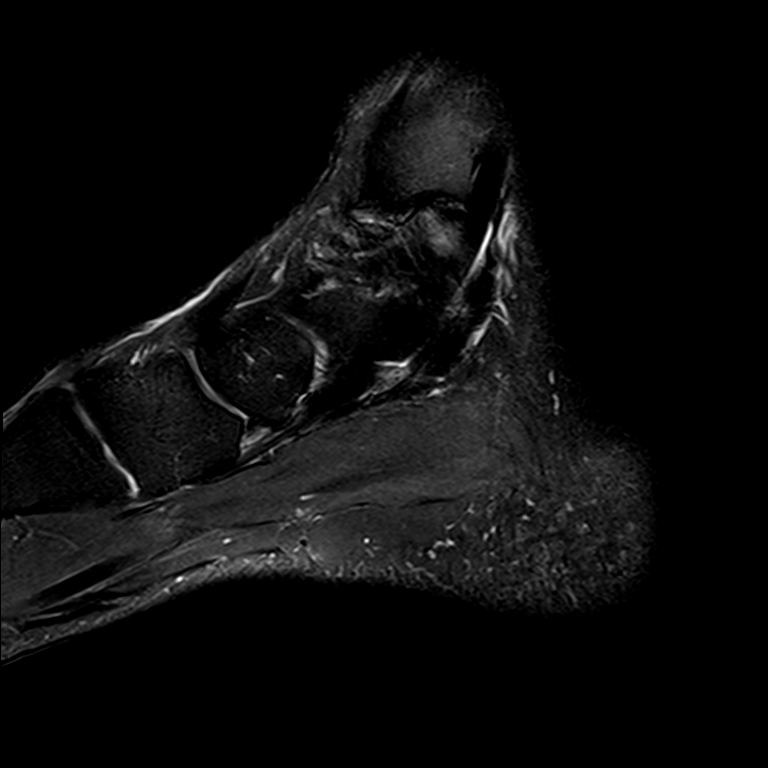

[Series 10: T2 fat-sat · axial · left · 4.0mm · 0.20mm/px · z∈[-70,-13]mm · 2 of 28 slices shown (2 of 2)]
[im 4/28]
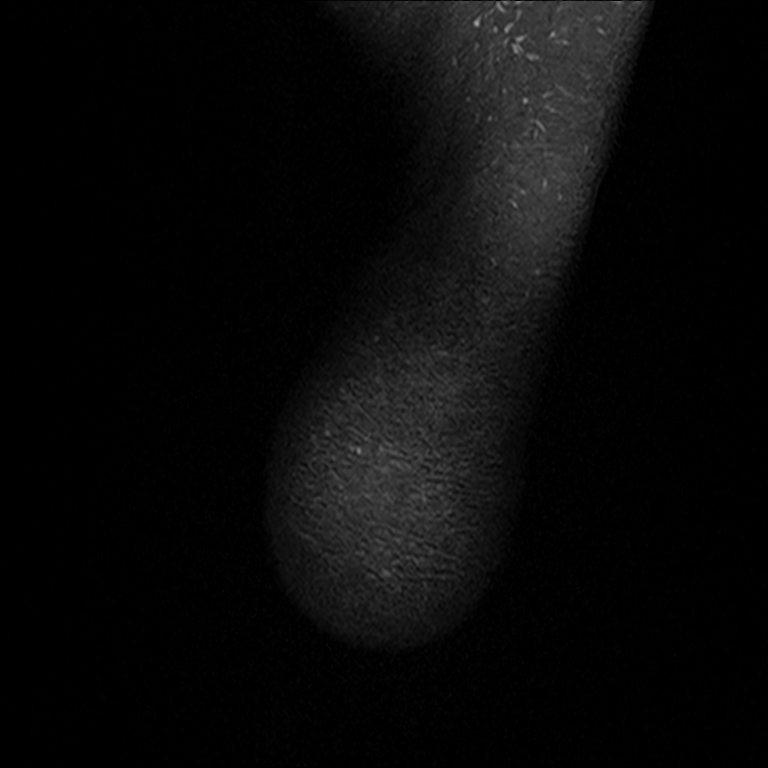
[im 16/28]
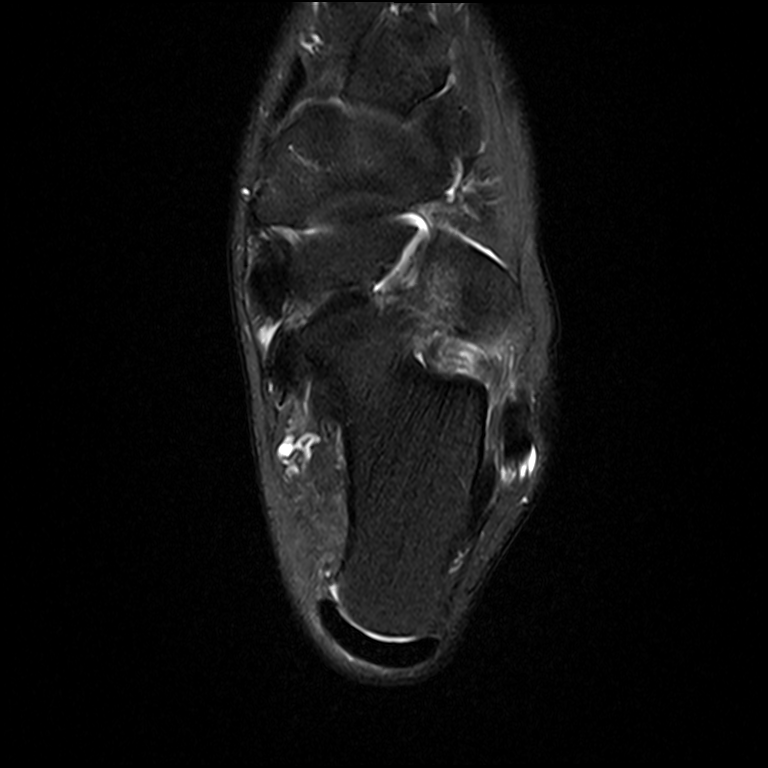

[Series 11: PD fat-sat · axial · left · 4.0mm · 0.20mm/px · z∈[-70,+25]mm · 3 of 28 slices shown]
[im 4/28]
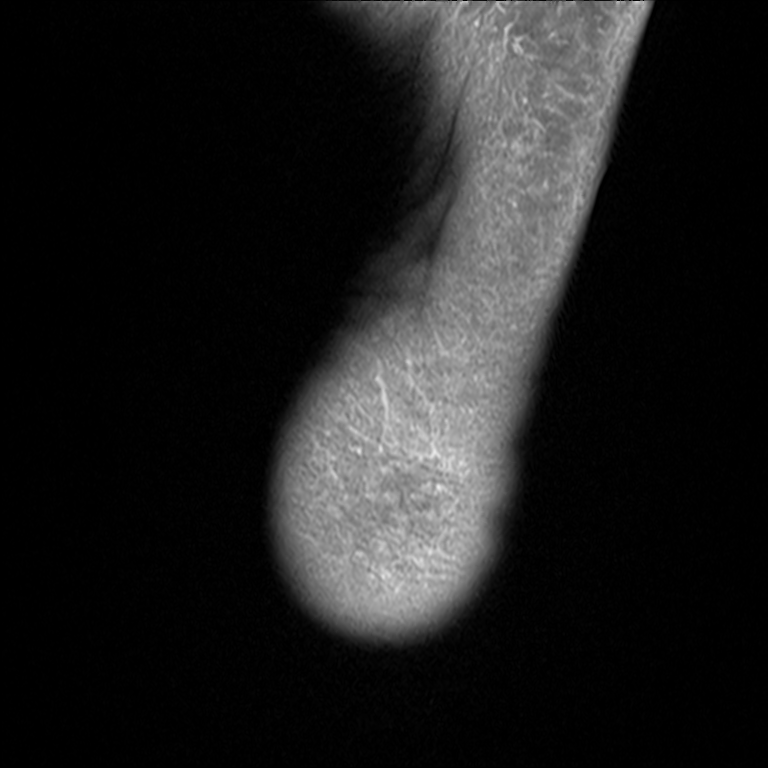
[im 16/28]
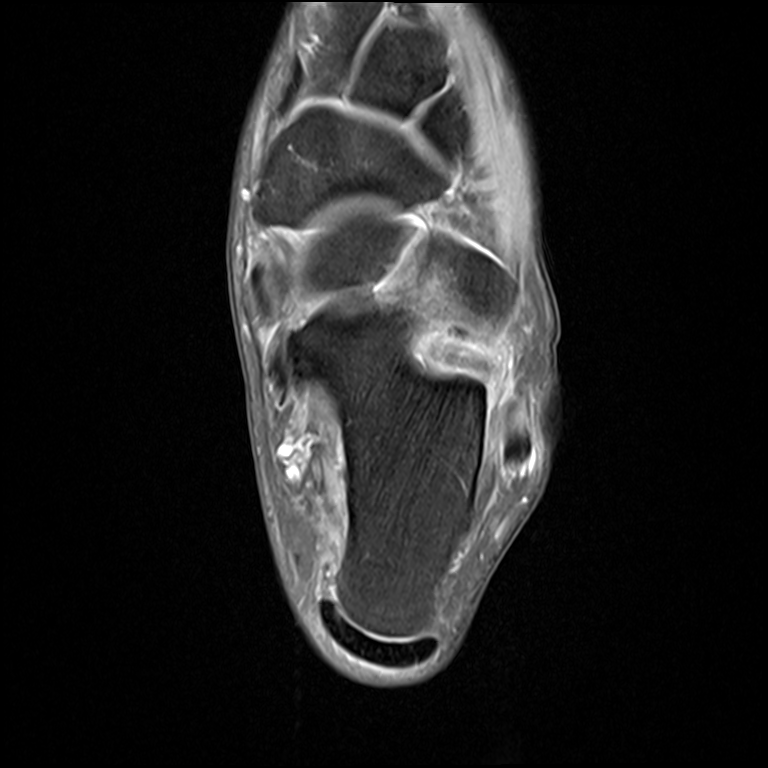
[im 24/28]
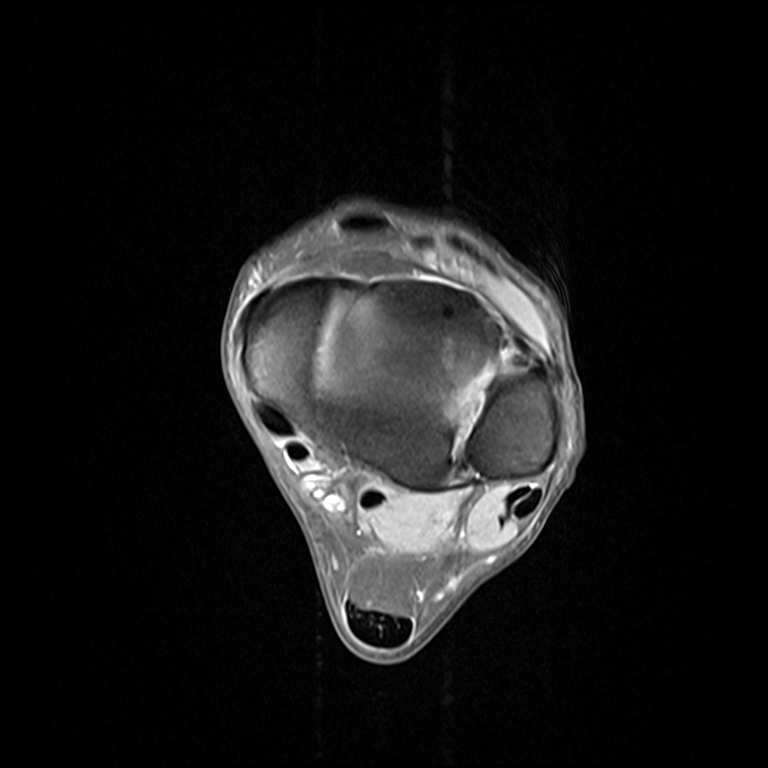

[8 of 40 positions shown; findings below may reference images not displayed]

FINDINGS: TENDONS

Peroneal: Peroneal longus tendon intact. Peroneal brevis intact.
Small amount of fluid in the peroneal tendon sheath as can be seen
with mild tenosynovitis.

Posteromedial: Posterior tibial tendon intact. Flexor hallucis
longus tendon intact. Flexor digitorum longus tendon intact.

Anterior: Tibialis anterior tendon intact. Extensor hallucis longus
tendon intact Extensor digitorum longus tendon intact.

Achilles: Minimal tendinosis of the Achilles tendon at its insertion
without a tear.

Plantar Fascia: Intact.

LIGAMENTS

Lateral: Mild thickening of the anterior talofibular ligament which
is increased in signal as can be seen with injury without
disruption. Mild adjacent intermediate signal likely representing
mild scarring. Calcaneofibular ligament intact. Posterior
talofibular ligament intact. Anterior and posterior tibiofibular
ligaments intact. Thickening of an accessory anterior inferior
tibiofibular ligament (Fallon ligament) which can result in
anterolateral ankle impingement.

Medial: Deltoid ligament intact. Spring ligament intact.

CARTILAGE

Ankle Joint: No joint effusion. Normal ankle mortise. Tiny
osteochondral lesion involving the lateral corner of the talar dome
with mild subchondral reactive marrow changes and partial-thickness
overlying cartilage loss. Intermediate signal material emanating
from the posterior tibiofibular joint protruding into the ankle
joint (image 18/series 12) which may reflect synovial thickening.

Subtalar Joints/Sinus Tarsi: Normal subtalar joints. No subtalar
joint effusion. Normal sinus tarsi.

Bones: No acute fracture or dislocation. No focal marrow signal
abnormality. Small well corticated ossific fragment adjacent to the
distal tip of the fibula likely reflecting sequela prior avulsive
injury.

Soft Tissue: No focal fluid collection or hematoma.
IMPRESSION: 1. Mild peroneal tenosynovitis.
2. Mild thickening of the anterior talofibular ligament which is
increased in signal as can be seen with injury without disruption.
Mild adjacent intermediate signal likely representing mild scarring.
3. Thickening of an accessory anterior inferior tibiofibular
ligament (Fallon ligament) which can result in anterolateral
ankle impingement.

## 2018-07-30 ENCOUNTER — Ambulatory Visit: Payer: BLUE CROSS/BLUE SHIELD | Admitting: Diagnostic Neuroimaging

## 2018-07-30 ENCOUNTER — Other Ambulatory Visit: Payer: Self-pay

## 2018-07-30 ENCOUNTER — Ambulatory Visit (INDEPENDENT_AMBULATORY_CARE_PROVIDER_SITE_OTHER): Payer: BLUE CROSS/BLUE SHIELD | Admitting: Diagnostic Neuroimaging

## 2018-07-30 DIAGNOSIS — Z0289 Encounter for other administrative examinations: Secondary | ICD-10-CM

## 2018-07-30 DIAGNOSIS — R200 Anesthesia of skin: Secondary | ICD-10-CM

## 2018-08-13 NOTE — Procedures (Signed)
   [facility]  NCS (NERVE CONDUCTION STUDY) WITH EMG (ELECTROMYOGRAPHY) REPORT   STUDY DATE: 07/30/18 PATIENT NAME: Tanner Martinez DOB: 07-Aug-1965 MRN: [id number]  ORDERING CLINICIAN: Shela Commons [place]  TECHNOLOGIST: Durenda Age ELECTROMYOGRAPHER: Glenford Bayley. Kamarii Carton, MD  CLINICAL INFORMATION: 53 year old male with neck pain radiating to the left arm with numbness.  FINDINGS: NERVE CONDUCTION STUDY:  Left median and left ulnar motor responses are normal.  Left radial, left median, left ulnar sensory responses are normal.  Left ulnar F wave latency is normal.   NEEDLE ELECTROMYOGRAPHY:  Needle examination of left upper extremity deltoid, biceps, triceps, flexor carpi radialis, first dorsal interosseous and left cervical paraspinal muscles normal.    IMPRESSION:   This is a normal study.  No electrodiagnostic evidence of large fiber neuropathy or left cervical radiculopathy at this time.    INTERPRETING PHYSICIAN:  Suanne Marker, MD Certified in Neurology, Neurophysiology and Neuroimaging  [facility] [address], [address] [place], [place] [address] [phone]   [facility]    Nerve / Sites Muscle Latency Ref. Amplitude Ref. Rel Amp Segments Distance Velocity Ref. Area    ms ms mV mV %  cm m/s m/s mVms  L Median - APB     Wrist APB 3.5 ?4.4 7.4 ?4.0 100 Wrist - APB 7   23.9     Upper arm APB 7.8  7.2  97.2 Upper arm - Wrist 21 49 ?49 23.9  L Ulnar - ADM     Wrist ADM 2.7 ?3.3 9.4 ?6.0 100 Wrist - ADM 7   25.5     B.Elbow ADM 6.7  8.5  89.8 B.Elbow - Wrist 21 53 ?49 24.0     A.Elbow ADM 8.7  7.8  92 A.Elbow - B.Elbow 10 49 ?49 23.3         A.Elbow - Wrist             SNC    Nerve / Sites Rec. Site Peak Lat Ref.  Amp Ref. Segments Distance    ms ms V V  cm  L Radial - Anatomical snuff box (Forearm)     Forearm Wrist 2.6 ?2.9 15 ?15 Forearm - Wrist 10  L Median - Orthodromic (Dig II, Mid palm)     Dig II Wrist 3.1 ?3.4 13 ?10  Dig II - Wrist 13  L Ulnar - Orthodromic, (Dig V, Mid palm)     Dig V Wrist 2.9 ?3.1 6 ?5 Dig V - Wrist 17           F  Wave    Nerve F Lat Ref.   ms ms  L Ulnar - ADM 31.6 ?32.0       EMG full       EMG Summary Table    Spontaneous MUAP Recruitment  Muscle IA Fib PSW Fasc Other Amp Dur. Poly Pattern  L. Deltoid Normal None None None _______ Normal Normal Normal Normal  L. Biceps brachii Normal None None None _______ Normal Normal Normal Normal  L. Triceps brachii Normal None None None _______ Normal Normal Normal Normal  L. Flexor carpi radialis Normal None None None _______ Normal Normal Normal Normal  L. First dorsal interosseous Normal None None None _______ Normal Normal Normal Normal  L. Cervical paraspinals Normal None None None _______ Normal Normal Normal Normal

## 2019-06-03 DIAGNOSIS — E291 Testicular hypofunction: Secondary | ICD-10-CM | POA: Diagnosis not present

## 2019-06-03 DIAGNOSIS — Z23 Encounter for immunization: Secondary | ICD-10-CM | POA: Diagnosis not present

## 2021-01-25 ENCOUNTER — Ambulatory Visit: Payer: BLUE CROSS/BLUE SHIELD | Admitting: Endocrinology

## 2021-03-08 ENCOUNTER — Other Ambulatory Visit: Payer: Self-pay

## 2021-03-08 ENCOUNTER — Ambulatory Visit (INDEPENDENT_AMBULATORY_CARE_PROVIDER_SITE_OTHER): Payer: BC Managed Care – PPO | Admitting: Endocrinology

## 2021-03-08 DIAGNOSIS — R7989 Other specified abnormal findings of blood chemistry: Secondary | ICD-10-CM | POA: Diagnosis not present

## 2021-03-08 DIAGNOSIS — G4730 Sleep apnea, unspecified: Secondary | ICD-10-CM

## 2021-03-08 NOTE — Patient Instructions (Addendum)
Blood tests are requested for you today.  We'll let you know about the results.  Based on the results, I hope to be able to prescribe for you a pill to increase the testosterone. Testosterone treatment has risks, including increased or decreased fertility (depending on the type of treatment), hair loss, prostate cancer, benign prostate enlargement, blood clots, liver problems, lower hdl ("good cholesterol"), polycythemia (opposite of anemia), sleep apnea, and behavior changes.   You can have blood drawn at med center [facility]

## 2021-03-08 NOTE — Progress Notes (Signed)
Subjective:    Patient ID: Tanner Martinez, male    DOB: November 13, 1965, 55 y.o.   MRN: [id number]  HPI Pt is referred by Dr Molly Maduro, for low testosterone level.  Pt reports he had puberty at the normal age.  He has 1 biological child.  He says he has never taken illicit androgens.  He has never had pituitary imaging. He took testosterone injections 2018-2022 (stopped a few mos ago.  He does not take antiandrogens or opioids.  He denies any h/o infertility, XRT, or genital infection.  He has never had surgery, or a serious injury to the head or genital area.  He has no h/o DVT.   He does not consume alcohol excessively.  Since off testosterone injections, he has fatigue and ED sxs.  Pt says he has been dissatisfied with the goal testosterone level at [facility], which he feels is too low. Past Medical History:  Diagnosis Date   Diabetes mellitus without complication (HCC)    diet controlled   Heartburn    Hypercholesterolemia    Hypertension    past medical history ; no longer on medications   Sleep apnea    " slight case don't wear CPAP or Bipap"    Past Surgical History:  Procedure Laterality Date   ANKLE ARTHROSCOPY     BACK SURGERY     MULTIPLE TOOTH EXTRACTIONS     REPAIR EXTENSOR TENDON Left 06/30/2015   Procedure: Repair Extensor Hallucis Longus Left Great Toe;  Surgeon: Nadara Mustard, MD;  Location: [facility] OR;  Service: Orthopedics;  Laterality: Left;    Social History   Socioeconomic History   Marital status: Married    Spouse name: Not on file   Number of children: Not on file   Years of education: Not on file   Highest education level: Not on file  Occupational History   Not on file  Tobacco Use   Smoking status: Former    Types: Cigarettes    Quit date: 05/20/2002    Years since quitting: 18.8   Smokeless tobacco: Former    Quit date: 05/20/2006  Substance and Sexual Activity   Alcohol use: Yes    Comment: social   Drug use: No   Sexual activity: Not on file  Other Topics  Concern   Not on file  Social History Narrative   Not on file   Social Determinants of Health   Financial Resource Strain: Not on file  Food Insecurity: Not on file  Transportation Needs: Not on file  Physical Activity: Not on file  Stress: Not on file  Social Connections: Not on file  Intimate Partner Violence: Not on file    Current Outpatient Medications on File Prior to Visit  Medication Sig Dispense Refill   ESZOPICLONE 3 MG tablet Take 3 mg by mouth at bedtime.      FINACEA 15 % cream Apply 1 application topically 2 (two) times daily.  99   HYDROcodone-acetaminophen (NORCO/VICODIN) 5-325 MG per tablet Take 1 tablet by mouth every 6 (six) hours as needed.  0   Multiple Vitamin (MULTI-VITAMINS) TABS Take by mouth.     nortriptyline (PAMELOR) 50 MG capsule TAKE ONE TABLET BY MOUTH AT NIGHT BEFORE BEDTIME  5   Syringe, Disposable, (2-3CC SYRINGE) 3 ML MISC Use as directed     SYRINGE-NEEDLE, DISP, 3 ML 22G X 1-1/2" 3 ML MISC USE AS DIRECTED     testosterone cypionate (DEPOTESTOTERONE CYPIONATE) 200 MG/ML injection Inject 200 mg into  the muscle every 14 (fourteen) days.      testosterone cypionate (DEPOTESTOSTERONE CYPIONATE) 200 MG/ML injection Inject into the muscle.     No current facility-administered medications on file prior to visit.    No Known Allergies  Family History  Problem Relation Age of Onset   Diabetes Mother    Diabetes Sister     BP 116/74 (BP Location: Right Arm, Patient Position: Sitting, Cuff Size: Normal)   Pulse 65   Ht 5\' 9"  (1.753 m)   Wt 206 lb 6.4 oz (93.6 kg)   SpO2 95%   BMI 30.48 kg/m    Review of Systems denies depression, weight change, headache, and sob.    Objective:   Physical Exam VS: see vs page GEN: no distress HEAD: head: no deformity eyes: no periorbital swelling, no proptosis external nose and ears are normal NECK: supple, thyroid is not enlarged CHEST WALL: no deformity LUNGS: clear to auscultation BREASTS:  No  gynecomastia CV: reg rate and rhythm, no murmur GENITALIA:  Normal male.   MUSCULOSKELETAL: muscle bulk and strength are grossly normal.  no joint swelling is seen  gait is normal and steady EXTEMITIES: no leg edema NEURO: sensation is intact to touch on all 4's SKIN:  Normal texture and temperature.  No rash or suspicious lesion is visible.  Normal male hair distribution.  NODES:  None palpable at the neck PSYCH: alert, well-oriented.  Does not appear anxious nor depressed.    I have reviewed outside records, and summarized:  Pt was noted to have low testosterone, and referred here.  Injections were stopped in mid-2022, for elevated level.  Dosage was 200 mg every 14 days.    outside test results are reviewed:  TSH /CBC/PSA/prolactin have been normal.   Lab Results  Component Value Date   TESTOSTERONE 205 (L) 03/08/2021   FSH=11 LH=11    Assessment & Plan:  Primary hypogonadism, new to me, uncertain etiology and prognosis.   I advised topical testosterone, but I agree with [facility]'s plan for goal of low-normal testosterone level.

## 2021-03-09 LAB — BASIC METABOLIC PANEL
BUN: 17 mg/dL (ref 6–23)
CO2: 29 mEq/L (ref 19–32)
Calcium: 9.6 mg/dL (ref 8.4–10.5)
Chloride: 106 mEq/L (ref 96–112)
Creatinine, Ser: 0.92 mg/dL (ref 0.40–1.50)
GFR: 94.51 mL/min (ref >60.00)
Glucose, Bld: 123 mg/dL — ABNORMAL HIGH (ref 70–99)
Potassium: 4.5 mEq/L (ref 3.5–5.1)
Sodium: 143 mEq/L (ref 135–145)

## 2021-03-09 LAB — TESTOSTERONE,FREE AND TOTAL
Testosterone, Free: 3 pg/mL — ABNORMAL LOW (ref 7.2–24.0)
Testosterone: 205 ng/dL — ABNORMAL LOW (ref 264–916)

## 2021-03-09 LAB — FOLLICLE STIMULATING HORMONE: FSH: 11.4 m[IU]/mL (ref 1.4–18.1)

## 2021-03-09 LAB — LUTEINIZING HORMONE: LH: 10.57 m[IU]/mL — ABNORMAL HIGH (ref 1.50–9.30)

## 2021-03-12 ENCOUNTER — Other Ambulatory Visit: Payer: Self-pay | Admitting: Endocrinology

## 2021-03-12 DIAGNOSIS — R7989 Other specified abnormal findings of blood chemistry: Secondary | ICD-10-CM

## 2021-03-12 MED ORDER — TESTOSTERONE CYPIONATE 200 MG/ML IM SOLN: 30.0000 mg | INTRAMUSCULAR | 0 refills | Status: DC

## 2021-03-13 ENCOUNTER — Telehealth: Payer: Self-pay | Admitting: Endocrinology

## 2021-03-13 ENCOUNTER — Other Ambulatory Visit: Payer: Self-pay | Admitting: Endocrinology

## 2021-03-13 MED ORDER — TESTOSTERONE CYPIONATE 200 MG/ML IM SOLN: 30.0000 mg | INTRAMUSCULAR | 0 refills | Status: DC

## 2021-03-13 NOTE — Telephone Encounter (Signed)
Pt spouse calling in to request we please send testosterone cypionate (DEPOTESTOSTERONE CYPIONATE) 200 MG/ML injection to  [id number] - [place], [place] - [address]  Pt contact [phone]

## 2021-03-20 ENCOUNTER — Telehealth: Payer: Self-pay | Admitting: Pharmacy Technician

## 2021-03-20 ENCOUNTER — Other Ambulatory Visit (HOSPITAL_COMMUNITY): Payer: Self-pay

## 2021-03-20 NOTE — Telephone Encounter (Signed)
Patient Advocate Encounter  Received notification from [employer] that prior authorization for TESTOSTERONE 200MG /ML is required.   PA submitted on 11.1.22 Key Status is pending   [facility] will continue to follow  [id number], CPhT Patient Advocate [facility] Phone: [phone] Fax:  [phone]

## 2021-03-21 NOTE — Telephone Encounter (Signed)
Received notification from [employer] regarding a prior authorization for TESTOSTERONE 200MG /ML. Authorization has been APPROVED from 11.1.22 to 11.1.23.    Authorization # [id number]

## 2021-07-27 ENCOUNTER — Other Ambulatory Visit
Admission: RE | Admit: 2021-07-27 | Discharge: 2021-07-27 | Disposition: A | Discharge status: Home / Self Care | Payer: BC Managed Care – PPO | Attending: Endocrinology | Admitting: Endocrinology

## 2021-07-27 DIAGNOSIS — R7989 Other specified abnormal findings of blood chemistry: Secondary | ICD-10-CM

## 2021-07-27 LAB — CBC WITH DIFFERENTIAL/PLATELET
Abs Immature Granulocytes: 0.01 10*3/uL (ref 0.00–0.07)
Basophils Absolute: 0.1 10*3/uL (ref 0.0–0.1)
Basophils Relative: 2 %
Eosinophils Absolute: 0.3 10*3/uL (ref 0.0–0.5)
Eosinophils Relative: 5 %
HCT: 40.1 % (ref 39.0–52.0)
Hemoglobin: 14.3 g/dL (ref 13.0–17.0)
Immature Granulocytes: 0 %
Lymphocytes Relative: 39 %
Lymphs Abs: 2.3 10*3/uL (ref 0.7–4.0)
MCH: 32.4 pg (ref 26.0–34.0)
MCHC: 35.7 g/dL (ref 30.0–36.0)
MCV: 90.9 fL (ref 80.0–100.0)
Monocytes Absolute: 0.5 10*3/uL (ref 0.1–1.0)
Monocytes Relative: 8 %
Neutro Abs: 2.8 10*3/uL (ref 1.7–7.7)
Neutrophils Relative %: 46 %
Platelets: 256 10*3/uL (ref 150–400)
RBC: 4.41 MIL/uL (ref 4.22–5.81)
RDW: 12.2 % (ref 11.5–15.5)
WBC: 6 10*3/uL (ref 4.0–10.5)
nRBC: 0 % (ref 0.0–0.2)

## 2021-07-27 NOTE — Addendum Note (Signed)
Addended by: Darrick Grinder on: 07/27/2021 02:24 PM   Modules accepted: Orders

## 2021-08-02 LAB — TESTOSTERONE,FREE AND TOTAL
Testosterone, Free: 3.1 pg/mL — ABNORMAL LOW (ref 7.2–24.0)
Testosterone: 261 ng/dL — ABNORMAL LOW (ref 264–916)

## 2021-08-04 ENCOUNTER — Encounter: Payer: Self-pay | Admitting: Endocrinology

## 2021-08-06 ENCOUNTER — Other Ambulatory Visit: Payer: Self-pay | Admitting: Endocrinology

## 2021-08-06 MED ORDER — TESTOSTERONE 12.5 MG/ACT (1%) TD GEL: 1.0000 | Freq: Every day | TRANSDERMAL | 0 refills | Status: DC

## 2021-08-07 ENCOUNTER — Other Ambulatory Visit (HOSPITAL_COMMUNITY): Payer: Self-pay

## 2021-08-07 ENCOUNTER — Telehealth: Payer: Self-pay

## 2021-08-07 NOTE — Telephone Encounter (Signed)
Patient Advocate Encounter ?  ?Received notification from [facility] that prior authorization for Testosterone 12.5mg  (1%) gel is required by his/her insurance [employer]. ?  ?PA submitted on 08/07/21 ? ?Key#: [id number] ? ?Status is pending ?   ?[facility] will continue to follow: ? ?Patient Advocate ?Fax: [phone]  ?

## 2021-08-09 ENCOUNTER — Other Ambulatory Visit (HOSPITAL_COMMUNITY): Payer: Self-pay

## 2021-08-09 NOTE — Telephone Encounter (Signed)
Patient Advocate Encounter ? ?Prior Authorization for Testosterone 12.5mg  1% gel has been approved.   ? ?PA# [id number] ? ?Effective dates: 08/07/21 through 08/08/22 ? ?Refill too soon. ? ?Patient Advocate ?Fax: [phone]  ?

## 2021-09-25 ENCOUNTER — Encounter: Payer: Self-pay | Admitting: Endocrinology

## 2021-10-04 ENCOUNTER — Ambulatory Visit: Payer: BC Managed Care – PPO | Admitting: Internal Medicine

## 2021-10-04 ENCOUNTER — Encounter: Payer: Self-pay | Admitting: Internal Medicine

## 2021-10-04 VITALS — BP 120/80 | HR 70 | Ht 69.0 in | Wt 214.0 lb

## 2021-10-04 DIAGNOSIS — G4730 Sleep apnea, unspecified: Secondary | ICD-10-CM | POA: Diagnosis not present

## 2021-10-04 DIAGNOSIS — E291 Testicular hypofunction: Secondary | ICD-10-CM

## 2021-10-04 MED ORDER — TESTOSTERONE 12.5 MG/ACT (1%) TD GEL
2.0000 | Freq: Every day | TRANSDERMAL | 5 refills | Status: DC
Start: 2021-10-04 — End: 2022-02-20

## 2021-10-04 NOTE — Patient Instructions (Signed)
-   INcrease Testosterone gel to 2 pumps daily

## 2021-10-04 NOTE — Progress Notes (Signed)
Name: Tanner Martinez  MRN/ DOB: [id number], 03/03/1966    Age/ Sex: 56 y.o., male     PCP: Norval Gable Carlena Bjornstad, MD   Reason for Endocrinology Evaluation: Hypogonadism      Initial Endocrinology Clinic Visit: 03/08/2021    PATIENT IDENTIFIER: Tanner Martinez is a 56 y.o., male with a past medical history of OSA, hypogonadism and T2DM  . He has followed with [facility] since 03/08/2021 for consultative assistance with management of his Hypogonadism .   HISTORICAL SUMMARY:   Pt is referred by Dr Molly Maduro, for low testosterone level.  Pt reported he had puberty at the normal age.  He has 1 biological child.  He says he has never taken illicit androgens.  He has never had pituitary imaging.  He has been diagnosed with hypogonadism in 2010.  He was on topical testosterone from 2010-2013 and switched to intramuscular testosterone injection in 2014.  He was off testosterone therapy by 2022 and before establishing with Dr. Everardo All here at [facility] endocrinology.  He does not take antiandrogens or opioids.  He denies any h/o infertility, XRT, or genital infection.  He has never had surgery, or a serious injury to the head or genital area.  He has no h/o DVT.   He does not consume alcohol excessively.  Since off testosterone injections, he has fatigue and ED sxs.  Pt says he has been dissatisfied with the goal testosterone level at [facility], which he feels is too low.  In 2021 while following up with [facility] endocrinology he was in 200 mg of testosterone q. 14 days  His LH has been elevated at 10.57 mIU/mL and normal FSH at 11.4 02/2021 , no prolactin at the time   SUBJECTIVE:     Today (10/04/2021):  [name] is here for follow-up on hypogonadism. He continues to feel that his testosterone levels are kept lower than his liking , he likes it to be high enough to give him more energy . He has no motivation to do things and has restless sleep , he wants to maintain better erection     He has been diagnosed with OSA , but he is not on CPAP but he couldn't sleep with the mask    Denies CAD not PE/DVT No Fh or personal hx prostate  He has frequency   Testosterone gel 1 pump daily to abdomen    HISTORY:  Past Medical History:  Past Medical History:  Diagnosis Date   Diabetes mellitus without complication (HCC)    diet controlled   Heartburn    Hypercholesterolemia    Hypertension    past medical history ; no longer on medications   Sleep apnea    " slight case don't wear CPAP or Bipap"   Past Surgical History:  Past Surgical History:  Procedure Laterality Date   ANKLE ARTHROSCOPY     BACK SURGERY     MULTIPLE TOOTH EXTRACTIONS     REPAIR EXTENSOR TENDON Left 06/30/2015   Procedure: Repair Extensor Hallucis Longus Left Great Toe;  Surgeon: Nadara Mustard, MD;  Location: [facility] OR;  Service: Orthopedics;  Laterality: Left;   Social History:  reports that he quit smoking about 19 years ago. His smoking use included cigarettes. He quit smokeless tobacco use about 15 years ago. He reports current alcohol use. He reports that he does not use drugs. Family History:  Family History  Problem Relation Age of Onset   Diabetes Mother    Diabetes Sister  HOME MEDICATIONS: Allergies as of 10/04/2021   No Known Allergies      Medication List        Accurate as of Oct 04, 2021  6:48 AM. If you have any questions, ask your nurse or doctor.          2-3CC SYRINGE 3 ML Misc Use as directed   eszopiclone 3 MG Tabs Generic drug: Eszopiclone Take 3 mg by mouth at bedtime.   Finacea 15 % gel Generic drug: Azelaic Acid Apply 1 application topically 2 (two) times daily.   HYDROcodone-acetaminophen 5-325 MG tablet Commonly known as: NORCO/VICODIN Take 1 tablet by mouth every 6 (six) hours as needed.   Multi-Vitamins Tabs Take by mouth.   nortriptyline 50 MG capsule Commonly known as: PAMELOR TAKE ONE TABLET BY MOUTH AT NIGHT BEFORE BEDTIME    SYRINGE-NEEDLE (DISP) 3 ML 22G X 1-1/2" 3 ML Misc USE AS DIRECTED   Testosterone 12.5 MG/ACT (1%) Gel Place 1 Pump onto the skin daily.          OBJECTIVE:   PHYSICAL EXAM: VS: There were no vitals taken for this visit.   EXAM: General: Pt appears well and is in NAD  Hydration: Well-hydrated with moist mucous membranes and good skin turgor  Eyes: External eye exam normal without stare, lid lag or exophthalmos.  EOM intact.  PERRL.  Ears, Nose, Throat: Hearing: Grossly intact bilaterally Dental: Good dentition  Throat: Clear without mass, erythema or exudate  Neck: General: Supple without adenopathy. Thyroid: Thyroid size normal.  No goiter or nodules appreciated. No thyroid bruit.  Lungs: Clear with good BS bilat with no rales, rhonchi, or wheezes  Heart: Auscultation: RRR.  Abdomen: Normoactive bowel sounds, soft, nontender, without masses or organomegaly palpable  Extremities: Gait and station: Normal gait  Digits and nails: No clubbing, cyanosis, petechiae, or nodes Head and neck: Normal alignment and mobility BL UE: Normal ROM and strength. BL LE: No pretibial edema normal ROM and strength.  Skin: Hair: Texture and amount normal with gender appropriate distribution Skin Inspection: No rashes, acanthosis nigricans/skin tags. No lipohypertrophy Skin Palpation: Skin temperature, texture, and thickness normal to palpation  Neuro: Cranial nerves: II - XII grossly intact  Cerebellar: Normal coordination and movement; no tremor Motor: Normal strength throughout DTRs: 2+ and symmetric in UE without delay in relaxation phase  Mental Status: Judgment, insight: Intact Orientation: Oriented to time, place, and person Memory: Intact for recent and remote events Mood and affect: No depression, anxiety, or agitation     DATA REVIEWED:   Latest Reference Range & Units 07/27/21 14:27  WBC 4.0 - 10.5 K/uL 6.0  RBC 4.22 - 5.81 MIL/uL 4.41  Hemoglobin 13.0 - 17.0 g/dL 32.4   HCT 40.1 - 02.7 % 40.1  MCV 80.0 - 100.0 fL 90.9  MCH 26.0 - 34.0 pg 32.4  MCHC 30.0 - 36.0 g/dL 25.3  RDW 66.4 - 40.3 % 12.2  Platelets 150 - 400 K/uL 256  nRBC 0.0 - 0.2 % 0.0  Neutrophils % 46  Lymphocytes % 39  Monocytes Relative % 8  Eosinophil % 5  Basophil % 2  Immature Granulocytes % 0  NEUT# 1.7 - 7.7 K/uL 2.8  Lymphocyte # 0.7 - 4.0 K/uL 2.3  Monocyte # 0.1 - 1.0 K/uL 0.5  Eosinophils Absolute 0.0 - 0.5 K/uL 0.3  Basophils Absolute 0.0 - 0.1 K/uL 0.1  Abs Immature Granulocytes 0.00 - 0.07 K/uL 0.01    Latest Reference Range & Units 07/27/21 14:27  Testosterone 264 - 916 ng/dL 161261 (L)  Testosterone Free 7.2 - 24.0 pg/mL 3.1 (L)    ASSESSMENT / PLAN / RECOMMENDATIONS:   Hypogonadism :   - Chronic hypogonadism since 2010  - Has been on variable testosterone formulations  - He continues to be symptomatic with fatigue, low libido and ED - Testosterone goal ~mid range  - We discussed side effects of testosterone therapy such as erythropoiesis, worsening of sleep apnea that is severe and untreated,  Prostate volumes and serum PSA increase in response to testosterone treatment which might increase BPH and worsen urinary outflow obstruction as well as prostate cancer risk.  - We also discussed there is a possibility of increased cardiovascular risk associated with testosterone use. - He had labs 2 months ago , will not recheck today but will recheck on next visit since I am increasing the testosterone dose    Medications   Increase Testosterone gel 2 pumps daily     2. OSA:  - I did explain to the pt that part of his symptoms may be attributes to untreated OSA. Discussed risk of worsening OSA if untreated in the setting of testosterone use as well as increase risk of CAD and HTN  - I have encouraged him to visit this with his PCP   Signed electronically by: Lyndle HerrlichAbby Jaralla Elsy Chiang, MD  [facility]  [facility] [address] [place], [place] [address] Phone: [phone] FAX: [phone]      CC: Maximiano CossHungarland, John David, MD [address] [facility] Phone: [phone]  Fax: [phone]   Return to Endocrinology clinic as below: Future Appointments  Date Time Provider Department Center  10/04/2021  8:30 AM Kiandre Spagnolo, Konrad DoloresIbtehal Jaralla, MD [id number] None

## 2022-02-08 ENCOUNTER — Ambulatory Visit: Payer: BC Managed Care – PPO | Admitting: Internal Medicine

## 2022-02-08 ENCOUNTER — Encounter: Payer: Self-pay | Admitting: Internal Medicine

## 2022-02-08 VITALS — BP 124/70 | HR 65 | Ht 69.0 in | Wt 216.0 lb

## 2022-02-08 DIAGNOSIS — E291 Testicular hypofunction: Secondary | ICD-10-CM | POA: Diagnosis not present

## 2022-02-08 DIAGNOSIS — R5383 Other fatigue: Secondary | ICD-10-CM | POA: Diagnosis not present

## 2022-02-08 LAB — COMPREHENSIVE METABOLIC PANEL
ALT: 19 U/L (ref 0–53)
AST: 18 U/L (ref 0–37)
Albumin: 4.2 g/dL (ref 3.5–5.2)
Alkaline Phosphatase: 72 U/L (ref 39–117)
BUN: 20 mg/dL (ref 6–23)
CO2: 28 mEq/L (ref 19–32)
Calcium: 9.3 mg/dL (ref 8.4–10.5)
Chloride: 100 mEq/L (ref 96–112)
Creatinine, Ser: 0.91 mg/dL (ref 0.40–1.50)
GFR: 95.14 mL/min (ref >60.00)
Glucose, Bld: 145 mg/dL — ABNORMAL HIGH (ref 70–99)
Potassium: 4.7 mEq/L (ref 3.5–5.1)
Sodium: 138 mEq/L (ref 135–145)
Total Bilirubin: 0.4 mg/dL (ref 0.2–1.2)
Total Protein: 7.2 g/dL (ref 6.0–8.3)

## 2022-02-08 LAB — CBC
HCT: 40.1 % (ref 39.0–52.0)
Hemoglobin: 13.6 g/dL (ref 13.0–17.0)
MCHC: 33.9 g/dL (ref 30.0–36.0)
MCV: 94.3 fl (ref 78.0–100.0)
Platelets: 237 10*3/uL (ref 150.0–400.0)
RBC: 4.25 Mil/uL (ref 4.22–5.81)
RDW: 12.5 % (ref 11.5–15.5)
WBC: 6.4 10*3/uL (ref 4.0–10.5)

## 2022-02-08 LAB — TSH: TSH: 1.95 u[IU]/mL (ref 0.35–5.50)

## 2022-02-08 LAB — T4, FREE: Free T4: 0.88 ng/dL (ref 0.60–1.60)

## 2022-02-08 LAB — PSA: PSA: 1.34 ng/mL (ref 0.10–4.00)

## 2022-02-08 LAB — VITAMIN B12: Vitamin B-12: 275 pg/mL (ref 211–911)

## 2022-02-08 LAB — VITAMIN D 25 HYDROXY (VIT D DEFICIENCY, FRACTURES): VITD: 31.16 ng/mL (ref 30.00–100.00)

## 2022-02-08 NOTE — Progress Notes (Signed)
Name: Tanner Martinez  MRN/ DOB: [id number], 1966-01-24    Age/ Sex: 56 y.o., male     PCP: Norval Gable Carlena Bjornstad, MD   Reason for Endocrinology Evaluation: Hypogonadism      Initial Endocrinology Clinic Visit: 03/08/2021    PATIENT IDENTIFIER: Tanner Martinez is a 56 y.o., male with a past medical history of OSA, hypogonadism and T2DM  . He has followed with [facility] [place] [facility] since 03/08/2021 for consultative assistance with management of his Hypogonadism .   HISTORICAL SUMMARY:   Pt is referred by Dr Molly Maduro, for low testosterone level.  Pt reported he had puberty at the normal age.  He has 1 biological child.  He says he has never taken illicit androgens.  He has never had pituitary imaging.  He has been diagnosed with hypogonadism in 2010.  He was on topical testosterone from 2010-2013 and switched to intramuscular testosterone injection in 2014.  He was off testosterone therapy by 2022 and before establishing with Dr. Everardo All here at [facility] endocrinology.  He does not take antiandrogens or opioids.  He denies any h/o infertility, XRT, or genital infection.  He has never had surgery, or a serious injury to the head or genital area.  He has no h/o DVT.   He does not consume alcohol excessively.  Since off testosterone injections, he has fatigue and ED sxs.  Pt says he has been dissatisfied with the goal testosterone level at [facility], which he feels is too low.  In 2021 while following up with [facility] endocrinology he was in 200 mg of testosterone q. 14 days  His LH has been elevated at 10.57 mIU/mL and normal FSH at 11.4 02/2021 , no prolactin at the time     Denies CAD not PE/DVT No Fh or personal hx prostate   SUBJECTIVE:     Today (02/08/2022):  Mr. [name] is here for follow-up on hypogonadism.   Weight has been stable  Energy stable overall  Denies rash  Continues with Erectile dysfunction  Has nipple piecing's and has occasional discharge that is white in color  but denies milky discharge, and he says the same thing happens to his ear piercing  He has been diagnosed with OSA , but he is not on CPAP but he couldn't sleep with the mask  Nocturia x1   Testosterone gel 1 pump  to each shoulder ( total 2 )    HISTORY:  Past Medical History:  Past Medical History:  Diagnosis Date   Diabetes mellitus without complication (HCC)    diet controlled   Heartburn    Hypercholesterolemia    Hypertension    past medical history ; no longer on medications   Sleep apnea    " slight case don't wear CPAP or Bipap"   Past Surgical History:  Past Surgical History:  Procedure Laterality Date   ANKLE ARTHROSCOPY     BACK SURGERY     MULTIPLE TOOTH EXTRACTIONS     REPAIR EXTENSOR TENDON Left 06/30/2015   Procedure: Repair Extensor Hallucis Longus Left Great Toe;  Surgeon: Nadara Mustard, MD;  Location: [facility] OR;  Service: Orthopedics;  Laterality: Left;   Social History:  reports that he quit smoking about 19 years ago. His smoking use included cigarettes. He quit smokeless tobacco use about 15 years ago. He reports current alcohol use. He reports that he does not use drugs. Family History:  Family History  Problem Relation Age of Onset   Diabetes Mother  Diabetes Sister      HOME MEDICATIONS: Allergies as of 02/08/2022   No Known Allergies      Medication List        Accurate as of February 08, 2022  7:50 AM. If you have any questions, ask your nurse or doctor.          ALPRAZolam 0.5 MG tablet Commonly known as: XANAX Take 0.5 mg by mouth 3 (three) times daily as needed.   eszopiclone 3 MG Tabs Generic drug: Eszopiclone Take 3 mg by mouth at bedtime.   Finacea 15 % gel Generic drug: Azelaic Acid Apply 1 application topically 2 (two) times daily.   HYDROcodone-acetaminophen 5-325 MG tablet Commonly known as: NORCO/VICODIN Take 1 tablet by mouth every 6 (six) hours as needed.   metFORMIN 500 MG tablet Commonly known as:  GLUCOPHAGE Take 1,000 mg by mouth 2 (two) times daily.   Multi-Vitamins Tabs Take by mouth.   nortriptyline 50 MG capsule Commonly known as: PAMELOR TAKE ONE TABLET BY MOUTH AT NIGHT BEFORE BEDTIME   omeprazole 40 MG capsule Commonly known as: PRILOSEC Take 40 mg by mouth daily.   pravastatin 40 MG tablet Commonly known as: PRAVACHOL Take 40 mg by mouth daily.   Testosterone 12.5 MG/ACT (1%) Gel Place 2 Pump onto the skin daily.          OBJECTIVE:   PHYSICAL EXAM: VS: BP 124/70 (BP Location: Left Arm, Patient Position: Sitting, Cuff Size: Large)   Pulse 65   Ht 5\' 9"  (1.753 m)   Wt 216 lb (98 kg)   SpO2 95%   BMI 31.90 kg/m    EXAM: General: Pt appears well and is in NAD  Neck: General: Supple without adenopathy. Thyroid: Thyroid size normal.  No goiter or nodules appreciated. No thyroid bruit.  Lungs: Clear with good BS bilat with no rales, rhonchi, or wheezes  Heart: Auscultation: RRR.  Abdomen: Normoactive bowel sounds, soft, nontender, without masses or organomegaly palpable  Extremities:  BL LE: No pretibial edema normal ROM and strength.  Mental Status: Judgment, insight: Intact Orientation: Oriented to time, place, and person Memory: Intact for recent and remote events Mood and affect: No depression, anxiety, or agitation     DATA REVIEWED:   Latest Reference Range & Units 02/08/22 08:01  Sodium 135 - 145 mEq/L 138  Potassium 3.5 - 5.1 mEq/L 4.7  Chloride 96 - 112 mEq/L 100  CO2 19 - 32 mEq/L 28  Glucose 70 - 99 mg/dL 02/10/22 (H)  BUN 6 - 23 mg/dL 20  Creatinine 244 - 0.10 mg/dL 2.72  Calcium 8.4 - 5.36 mg/dL 9.3  Alkaline Phosphatase 39 - 117 U/L 72  Albumin 3.5 - 5.2 g/dL 4.2  AST 0 - 37 U/L 18  ALT 0 - 53 U/L 19  Total Protein 6.0 - 8.3 g/dL 7.2  Total Bilirubin 0.2 - 1.2 mg/dL 0.4  GFR 64.4 mL/min 95.14  VITD 30.00 - 100.00 ng/mL 31.16  Vitamin B12 211 - 911 pg/mL 275  WBC 4.0 - 10.5 K/uL 6.4  RBC 4.22 - 5.81 Mil/uL 4.25   Hemoglobin 13.0 - 17.0 g/dL >03.47  HCT 42.5 - 95.6 % 40.1  MCV 78.0 - 100.0 fl 94.3  MCHC 30.0 - 36.0 g/dL 38.7  RDW 56.4 - 33.2 % 12.5  Platelets 150.0 - 400.0 K/uL 237.0     Latest Reference Range & Units 02/08/22 08:01  Testosterone, Total, LC-MS-MS 250 - 1,100 ng/dL 02/10/22 (L)  TSH 884 - 1.66  uIU/mL 1.95  T4,Free(Direct) 0.60 - 1.60 ng/dL 0.88  PSA 0.10 - 4.00 ng/mL 1.34      ASSESSMENT / PLAN / RECOMMENDATIONS:   Hypogonadism :   - Chronic hypogonadism since 2010  - Has been on variable testosterone formulations  - He continues to be symptomatic with fatigue, land ED - Testosterone goal ~mid range  - Repeat testosterone continues to be low, will switch to IM injections - CBC, CMP and PSA normal   Medications   STOP Testosterone gel 2 pumps daily  Start testosterone cypionate 200mg /mL 100 mg Q 14 days   2. Fatigue : - We again discussed importance of addressing sleep apnea - No evidence of anemia nor vitamin deficiencies     Signed electronically by: Mack Guise, MD  [facility]  [address] [place], [place] [address] Phone: [phone] FAX: [phone]      CC: Yvone Neu, MD [place] Phone: [phone]  Fax: [phone]   Return to Endocrinology clinic as below: No future appointments.

## 2022-02-12 LAB — TESTOSTERONE, TOTAL, LC/MS/MS: Testosterone, Total, LC-MS-MS: 144 ng/dL — ABNORMAL LOW (ref 250–1100)

## 2022-02-12 LAB — PROLACTIN: Prolactin: 4.5 ng/mL (ref 2.0–18.0)

## 2022-02-14 ENCOUNTER — Encounter: Payer: Self-pay | Admitting: Internal Medicine

## 2022-02-14 MED ORDER — TESTOSTERONE CYPIONATE 200 MG/ML IM SOLN: 100.0000 mg | INTRAMUSCULAR | 1 refills | Status: AC

## 2022-02-14 MED ORDER — "BD LUER-LOK SYRINGE 22G X 1-1/2"" 3 ML MISC": 1.0000 | 2 refills | Status: AC

## 2022-06-21 ENCOUNTER — Ambulatory Visit: Payer: BC Managed Care – PPO | Admitting: Internal Medicine

## 2022-08-20 DIAGNOSIS — E291 Testicular hypofunction: Secondary | ICD-10-CM | POA: Diagnosis not present

## 2022-08-28 DIAGNOSIS — Z79899 Other long term (current) drug therapy: Secondary | ICD-10-CM | POA: Diagnosis not present

## 2022-08-28 DIAGNOSIS — Z125 Encounter for screening for malignant neoplasm of prostate: Secondary | ICD-10-CM | POA: Diagnosis not present

## 2022-08-28 DIAGNOSIS — E291 Testicular hypofunction: Secondary | ICD-10-CM | POA: Diagnosis not present

## 2022-12-12 ENCOUNTER — Telehealth: Payer: Self-pay

## 2022-12-13 ENCOUNTER — Encounter: Payer: Self-pay | Admitting: Internal Medicine

## 2022-12-16 ENCOUNTER — Telehealth: Payer: Self-pay | Admitting: Internal Medicine

## 2022-12-16 ENCOUNTER — Telehealth: Payer: Self-pay

## 2022-12-16 NOTE — Telephone Encounter (Signed)
[facility] Requesting Refills on Testosterone Cypionate

## 2022-12-16 NOTE — Telephone Encounter (Signed)
This letter serves as a notification of the removal of Lesly Dukes, MD a your Endocrinologist. Please request testosterone refills from your new provider at [facility], 12/13/22.

## 2022-12-17 NOTE — Telephone Encounter (Signed)
Noted I found the previous note, thank you

## 2023-06-04 DIAGNOSIS — E291 Testicular hypofunction: Secondary | ICD-10-CM | POA: Diagnosis not present

## 2023-06-04 NOTE — Progress Notes (Signed)
 ENDOCRINOLOGY CLINIC NOTE   Cc: Hungarland, Norleen Lenis, MD [address] [facility] [place],  [place] [id number]  CHIEF COMPLAINT: hypogonadism in male  PAST MEDICAL HISTORY: Patient Active Problem List  Diagnosis  . Male hypogonadism  . Diabetes (CMS/HHS-HCC)  . Sleep apnea  . Hypogonadism, male   HISTORY OF PRESENT ILLNESS:  08/20/2022 Tanner Martinez, 58 y.o., male with the above medical complaints who presents today for evaluation of hypogonadism. Patient was last seen at the [facility] Endo clinic on 06/03/2019 for the same condition before transferring his care to [facility], which is closer to his home. Patient has been taking Depo Testosterone  200 mg [place] every 14 days. Patient reports he has decided to switch back to [facility] from [facility] at this time. Most recent labwork in the [facility] shows Total testosterone  of 678 ng/dl and Free testosterone  of 23 ng/dl in 7984. Patient reports he was under the care of a local endocrinologist in [place] who left the practice. He reports he last took Depo-Testosterone  months ago and that was in 2023. Patient reports he was diagnosed with hypothyroidism around 2008-2009 after he had been experiencing ED, insomnia, low libido and fatigue. He reports he still has ED and uses Cialis PRN for the condition. Reports no CP, N/V, or SOB today.  06/04/2023 Patient is a 58 year old male here for follow up for hypogonadism in male. Last visit was on 08/20/2022. He takes Depo Testosterone  200 mg [place] every 14 days. Last dose was administered almost 2 weeks ago. He reports no change to his health since his last visit. He missed a couple of follow up appointments since last visit, but has been following up with his local PCP for routine cares. Labwork in April, 2024 showed total testosterone  of 219 ng/dl with free testosterone  of 5.06 ng/dl on 95/89/7975. His Hct was 40.5%. Patient reports he still has ED  regardless of treatments, currently on Cialis. Reports no CP or SOB today.   History includes: Age of onset of puberty:11-13 Sexual development including axillary hair, increase in testicular size:  11-13 Number of children: 1 Breast discomfort, enlargement and discharge: no Onset of symptoms concerning for low testosterone : 2008 Libido: low still ED: yes, even with Cialis or Viagra Energy: low Opiate use: No  OTC testosterone / exogenous testosterone  use: no  He denies use of drugs associated which include estrogens, hCG, flutamide, biclutamide, nilutamide, cyproterone, GRH agonists (leuprolide and goserelin), metronidazole, ketoconazole, minocycline, isoniazid, cimetidine, ranitidine, omeprazole, methotrexate, alkylating agents, vinca alkaloids, digoxin, ACEIs (captoril and enalopril), CCB (diltiazem, nifedipine, verapamil), amiodarone, methyldopa, spironolactone, reserpine, and minoxidile.  Risk for testosterone  replacement: H/o Prostate cancer: no CAD, stroke:  no H/o OSA:  no, had sleep study which reports mild OSA.  DVT/PE: no Desire to have children:  No  SOCIAL HISTORY: Social History   Tobacco Use  . Smoking status: Former    Current packs/day: 0.00    Types: Cigarettes    Quit date: 10/24/2003    Years since quitting: 19.6    FAMILY HISTORY: No family history on file.  ALLERGIES: No Known Allergies  MEDICATIONS: Current Outpatient Medications on File Prior to Visit  Medication Sig Dispense Refill  . ALPRAZolam (XANAX) 0.5 MG tablet Take 0.5 mg by mouth 3 (three) times daily as needed    . BD LUER-LOK SYRINGE 3 mL 22 gauge x 1 Syrg Inject 1 each into the muscle every 14 (fourteen) days 100 Syringe 1  . eszopiclone (LUNESTA)  3 mg tablet Take 3 mg by mouth daily. Take immediately before bedtime.    . metFORMIN (GLUCOPHAGE) 500 MG tablet Take 1,000 mg by mouth daily with breakfast    . omeprazole (PRILOSEC) 40 MG DR capsule Take 40 mg by mouth once daily    .  pravastatin (PRAVACHOL) 40 MG tablet Take 40 mg by mouth once daily    . syringe, disposable, 3 mL Syrg Use as directed 50 each 1  . tadalafiL (CIALIS) 20 MG tablet Take 20 mg by mouth once daily as needed    . testosterone  cypionate (DEPO-TESTOSTERONE ) 200 mg/mL injection Inject 1 mL (200 mg total) into the muscle every 14 (fourteen) days 6 mL 0  . Herbal Supplement Proascea (Patient not taking: Reported on 06/04/2023)    . HYDROcodone -acetaminophen  (NORCO) 5-325 mg tablet Take by mouth (Patient not taking: Reported on 06/04/2023)    . metFORMIN (GLUCOPHAGE) 500 MG tablet Take by mouth (Patient not taking: Reported on 06/04/2023)    . multivitamin tablet Take 1 tablet by mouth daily. (Patient not taking: Reported on 06/04/2023)    . needle, disp, 22 G 22 x 1 1/4  Ndle Use as directed (Patient not taking: Reported on 06/04/2023) 50 each 1  . nortriptyline (PAMELOR) 50 MG capsule Take 50 mg by mouth nightly (Patient not taking: Reported on 06/04/2023)    . omeprazole (PRILOSEC) 40 MG DR capsule  (Patient not taking: Reported on 06/04/2023)     No current facility-administered medications on file prior to visit.    REVIEW OF SYSTEMS: A review of systems was obtained and negative, except as documented above in the HPI.  PHYSICAL EXAM: VITALS: Resp 18   Ht 177.8 cm (5' 10)   Wt 96.3 kg (212 lb 4.9 oz)   BMI 30.46 kg/m  Vitals:   06/04/23 1419  Resp: 18   Labs (prior) Lab Results  Component Value Date   TSH 2.33 08/28/2022   Gen:   AAOX3, no apparent distress  HEENT:  Supple, thyroid  not palpable, no LAD Lungs:  CTA B/L  Heart:   nl S1/S2, RRR, no murmur appreciated  GI:   +BS, Soft NT/ND Skin:   No rashes, no hyperpigmentation of skin noted MSK:   Normal range of motion, no proximal muscle weakness  Neuro:  AAOx3, Fluent speech Psych:   normal mood, affect  Testicular exam: testicular volume : Deferred  LABORATORY/DIAGNOSTIC IMAGING STUDIES: pertinent labs and imaging was reviewed  and noted above in the HPI   ASSESSMENT/PLAN: Tanner Martinez, 58 y.o., male with the above medical complaints who presents today for follow up of hypogonadism in male. He was under the care of local endocrinologists in [place]. Taking Depo T 200 mg every two weeks. Patient continues to experience ED, low libido regardless of his treatment. We will repeat his labs and adjust the dose accordingly.  [x]  FSH, LH, testosterone  8 am and fasting.  [x]  Pituitary MRI if level of Total T < 150 on two occasions [x]   Follow up in 6 months  Discussed in detail risk of testosterone  replacement which is not limited to local injection site irritation, worsening OSA, infertility which could be irreversible, CAD, Stroke, Unmasking prostate cancer, DVT and PE. Patient agree with starting testosterone  if needed despite tse potential adr.   ----------------------------- MONITORING: Evaluate 3-6 months after treatment begins -goal for injectable testosterone  is 400-700 ng/dl 1 week after injection Measure Hct at baseline, then 3-6 months, then annually, and stop if >54%, evaluate for  hypoxia and sleep apnea if pertinent Men >9 yo, get baseline PSA and if >0.6 ng/ml, do DRE, then remeasure at 3-6 months, then annually     The pt was seen and plans were discussed w/ pt as documented. I spent a total of 50 minutes of direct and indirect care of this patient. Thanks for the opportunity.   _________________________________________________ Fairy FREDRIK Killian, DNP, APRN, FNP-C Division of Endocrinology, metabolism and nutrition [facility]  Cc: Hungarland, Norleen Lenis, MD [address] [facility] & [place],  [place] [id number]

## 2023-06-09 DIAGNOSIS — E291 Testicular hypofunction: Secondary | ICD-10-CM | POA: Diagnosis not present

## 2023-08-04 DIAGNOSIS — Z981 Arthrodesis status: Secondary | ICD-10-CM | POA: Diagnosis not present

## 2023-08-04 DIAGNOSIS — M4322 Fusion of spine, cervical region: Secondary | ICD-10-CM | POA: Diagnosis not present

## 2023-11-15 ENCOUNTER — Other Ambulatory Visit: Payer: Self-pay

## 2023-11-15 ENCOUNTER — Emergency Department (HOSPITAL_COMMUNITY)
Admission: EM | Admit: 2023-11-15 | Discharge: 2023-11-15 | Disposition: A | Discharge status: Home / Self Care | Attending: Emergency Medicine | Admitting: Emergency Medicine

## 2023-11-15 ENCOUNTER — Emergency Department (HOSPITAL_COMMUNITY)

## 2023-11-15 ENCOUNTER — Encounter (HOSPITAL_COMMUNITY): Payer: Self-pay

## 2023-11-15 DIAGNOSIS — M5021 Other cervical disc displacement,  high cervical region: Secondary | ICD-10-CM | POA: Diagnosis not present

## 2023-11-15 DIAGNOSIS — M542 Cervicalgia: Secondary | ICD-10-CM | POA: Insufficient documentation

## 2023-11-15 DIAGNOSIS — M25512 Pain in left shoulder: Secondary | ICD-10-CM | POA: Insufficient documentation

## 2023-11-15 DIAGNOSIS — M546 Pain in thoracic spine: Secondary | ICD-10-CM | POA: Diagnosis not present

## 2023-11-15 DIAGNOSIS — M4804 Spinal stenosis, thoracic region: Secondary | ICD-10-CM | POA: Diagnosis not present

## 2023-11-15 DIAGNOSIS — M50223 Other cervical disc displacement at C6-C7 level: Secondary | ICD-10-CM | POA: Diagnosis not present

## 2023-11-15 DIAGNOSIS — R200 Anesthesia of skin: Secondary | ICD-10-CM | POA: Diagnosis not present

## 2023-11-15 DIAGNOSIS — I10 Essential (primary) hypertension: Secondary | ICD-10-CM | POA: Diagnosis not present

## 2023-11-15 DIAGNOSIS — M4802 Spinal stenosis, cervical region: Secondary | ICD-10-CM | POA: Diagnosis not present

## 2023-11-15 DIAGNOSIS — M5412 Radiculopathy, cervical region: Secondary | ICD-10-CM | POA: Diagnosis not present

## 2023-11-15 LAB — COMPREHENSIVE METABOLIC PANEL WITH GFR
ALT: 18 U/L (ref 0–44)
AST: 24 U/L (ref 15–41)
Albumin: 3.9 g/dL (ref 3.5–5.0)
Alkaline Phosphatase: 36 U/L — ABNORMAL LOW (ref 38–126)
Anion gap: 10 (ref 5–15)
BUN: 19 mg/dL (ref 6–20)
CO2: 24 mmol/L (ref 22–32)
Calcium: 9 mg/dL (ref 8.9–10.3)
Chloride: 104 mmol/L (ref 98–111)
Creatinine, Ser: 0.93 mg/dL (ref 0.61–1.24)
GFR, Estimated: >60 mL/min (ref >60)
Glucose, Bld: 99 mg/dL (ref 70–99)
Potassium: 4.5 mmol/L (ref 3.5–5.1)
Sodium: 138 mmol/L (ref 135–145)
Total Bilirubin: 0.7 mg/dL (ref 0.0–1.2)
Total Protein: 6.7 g/dL (ref 6.5–8.1)

## 2023-11-15 LAB — CBC WITH DIFFERENTIAL/PLATELET
Abs Immature Granulocytes: 0.01 10*3/uL (ref 0.00–0.07)
Basophils Absolute: 0.1 10*3/uL (ref 0.0–0.1)
Basophils Relative: 1 %
Eosinophils Absolute: 0.3 10*3/uL (ref 0.0–0.5)
Eosinophils Relative: 5 %
HCT: 40.4 % (ref 39.0–52.0)
Hemoglobin: 14.3 g/dL (ref 13.0–17.0)
Immature Granulocytes: 0 %
Lymphocytes Relative: 28 %
Lymphs Abs: 1.8 10*3/uL (ref 0.7–4.0)
MCH: 33.6 pg (ref 26.0–34.0)
MCHC: 35.4 g/dL (ref 30.0–36.0)
MCV: 94.8 fL (ref 80.0–100.0)
Monocytes Absolute: 0.5 10*3/uL (ref 0.1–1.0)
Monocytes Relative: 8 %
Neutro Abs: 3.7 10*3/uL (ref 1.7–7.7)
Neutrophils Relative %: 58 %
Platelets: 224 10*3/uL (ref 150–400)
RBC: 4.26 MIL/uL (ref 4.22–5.81)
RDW: 13.2 % (ref 11.5–15.5)
WBC: 6.3 10*3/uL (ref 4.0–10.5)
nRBC: 0 % (ref 0.0–0.2)

## 2023-11-15 LAB — TROPONIN I (HIGH SENSITIVITY): Troponin I (High Sensitivity): 3 ng/L (ref <18)

## 2023-11-15 MED ORDER — OXYCODONE-ACETAMINOPHEN 5-325 MG PO TABS
1.0000 | ORAL_TABLET | Freq: Once | ORAL | Status: AC
  Administered 2023-11-15: 1 via ORAL
  Filled 2023-11-15: qty 1

## 2023-11-15 MED ORDER — KETOROLAC TROMETHAMINE 15 MG/ML IJ SOLN
15.0000 mg | Freq: Once | INTRAMUSCULAR | Status: AC
  Administered 2023-11-15: 15 mg via INTRAVENOUS
  Filled 2023-11-15: qty 1

## 2023-11-15 MED ORDER — PREDNISONE 10 MG PO TABS: 40.0000 mg | ORAL_TABLET | Freq: Every day | ORAL | 0 refills | Status: AC

## 2023-11-15 MED ORDER — HYDROCODONE-ACETAMINOPHEN 5-325 MG PO TABS: 1.0000 | ORAL_TABLET | Freq: Four times a day (QID) | ORAL | 0 refills | Status: DC | PRN

## 2023-11-15 MED ORDER — DEXAMETHASONE SODIUM PHOSPHATE 10 MG/ML IJ SOLN
10.0000 mg | Freq: Once | INTRAMUSCULAR | Status: AC
  Administered 2023-11-15: 10 mg via INTRAVENOUS
  Filled 2023-11-15: qty 1

## 2023-11-15 NOTE — ED Notes (Signed)
 Pt/family received d/c paperwork at this time. After going over the paperwork any questions, comments, or concerns were answered to the best of this nurse's knowledge. The pt/family verbally acknowledged the teachings/instructions.

## 2023-11-15 NOTE — ED Triage Notes (Signed)
 Pt presents to ED from home C/O neck and L shoulder pain X 2-3 weeks. Pt reports this AM upon wakening noticed half of L hand was numb. Speech clear. No weakness noted.

## 2023-11-15 NOTE — Discharge Instructions (Signed)
 Please follow-up closely with neurosurgery on an outpatient basis.  Return to emergency department immediately for any new or worsening symptoms.  Please start taking the prednisone tomorrow.

## 2023-11-15 NOTE — ED Provider Notes (Signed)
 [place] EMERGENCY DEPARTMENT AT [facility] Provider Note   CSN: [id number] Arrival date & time: 11/15/23  9066     Patient presents with: Neck Pain   Tanner Martinez is a 58 y.o. male.   Patient is a 58 year old male with a history of previous spinal fusion who presents emergency department the chief complaint of pain to his neck, upper thoracic spine, with radiation down his left arm and numbness in the first 3 digits of the left hand.  Patient notes that symptoms have worsened over the past 24 hours.  He notes that he has been evaluated by orthopedics previously and is currently working on an appointment to see orthopedics and spine surgery.  Patient notes he has had no recent falls or blunt trauma.  He notes that he has had symptoms similar to this in the past.  He denies any associated chest pain, shortness of breath, abdominal pain, nausea, vomiting, diarrhea.   Neck Pain      Prior to Admission medications   Medication Sig Start Date End Date Taking? Authorizing Provider  ALPRAZolam (XANAX) 0.5 MG tablet Take 0.5 mg by mouth 3 (three) times daily as needed. 01/13/22   [provider]  ESZOPICLONE 3 MG tablet Take 3 mg by mouth at bedtime.  09/03/12   [provider]  FINACEA 15 % cream Apply 1 application topically 2 (two) times daily. 05/16/15   [provider]  HYDROcodone-acetaminophen (NORCO/VICODIN) 5-325 MG per tablet Take 1 tablet by mouth every 6 (six) hours as needed. 11/11/14   [provider]  metFORMIN (GLUCOPHAGE) 500 MG tablet Take 1,000 mg by mouth 2 (two) times daily. 01/23/22   [provider]  Multiple Vitamin (MULTI-VITAMINS) TABS Take by mouth.    [provider]  nortriptyline (PAMELOR) 50 MG capsule TAKE ONE TABLET BY MOUTH AT NIGHT BEFORE BEDTIME Patient not taking: Reported on 02/08/2022 11/11/14   [provider]  omeprazole (PRILOSEC) 40 MG capsule Take 40 mg by mouth daily. 01/08/22    [provider]  pravastatin (PRAVACHOL) 40 MG tablet Take 40 mg by mouth daily. 01/13/22   [provider]  SYRINGE-NEEDLE, DISP, 3 ML (B-D 3CC LUER-LOK SYR 22GX1-1/2) 22G X 1-1/2 3 ML MISC 1 Device by Does not apply route every 14 (fourteen) days. 02/14/22   Shamleffer, Ibtehal Jaralla, MD  testosterone  cypionate (DEPOTESTOSTERONE CYPIONATE) 200 MG/ML injection Inject 0.5 mLs (100 mg total) into the muscle every 14 (fourteen) days. 02/14/22   Shamleffer, Ibtehal Jaralla, MD    Allergies: Patient has no known allergies.    Review of Systems  Musculoskeletal:  Positive for neck pain.  All other systems reviewed and are negative.   Updated Vital Signs BP 120/85   Pulse 69   Temp 98 F (36.7 C) (Oral)   Resp 12   Ht 5' 9 (1.753 m)   Wt 97.5 kg   SpO2 97%   BMI 31.75 kg/m   Physical Exam Vitals and nursing note reviewed.  Constitutional:      Appearance: Normal appearance.  HENT:     Head: Normocephalic and atraumatic.     Nose: Nose normal.     Mouth/Throat:     Mouth: Mucous membranes are moist.   Eyes:     Extraocular Movements: Extraocular movements intact.     Conjunctiva/sclera: Conjunctivae normal.     Pupils: Pupils are equal, round, and reactive to light.   Neck:     Comments: Tender to palpation over  lower cervical spine, tender to palpation over left trapezius muscle, no step-off or deformity Cardiovascular:     Rate and Rhythm: Normal rate and regular rhythm.     Pulses: Normal pulses.     Heart sounds: Normal heart sounds. No murmur heard.    No gallop.  Pulmonary:     Effort: Pulmonary effort is normal. No respiratory distress.     Breath sounds: Normal breath sounds. No stridor. No wheezing, rhonchi or rales.  Abdominal:     General: Abdomen is flat. Bowel sounds are normal. There is no distension.     Palpations: Abdomen is soft.     Tenderness: There is no abdominal tenderness. There is no guarding.   Musculoskeletal:         General: Normal range of motion.     Cervical back: Normal range of motion and neck supple.     Comments: Tender to palpation over upper thoracic spine, nontender palpation over lumbar spine, no step-off or deformity, nontender palpation over bilateral upper extremities, radial pulse 2+ distally bilateral posterior knees, decreased sensation in first 3 digits of left hand, full range of motion noted throughout bilateral upper extremity   Skin:    General: Skin is warm and dry.     Findings: No bruising or rash.   Neurological:     General: No focal deficit present.     Mental Status: He is alert and oriented to person, place, and time. Mental status is at baseline.     Cranial Nerves: No cranial nerve deficit.     Sensory: No sensory deficit.     Motor: No weakness.     Coordination: Coordination normal.     Gait: Gait normal.   Psychiatric:        Mood and Affect: Mood normal.        Behavior: Behavior normal.        Thought Content: Thought content normal.        Judgment: Judgment normal.     (all labs ordered are listed, but only abnormal results are displayed) Labs Reviewed  CBC WITH DIFFERENTIAL/PLATELET  COMPREHENSIVE METABOLIC PANEL WITH GFR  TROPONIN I (HIGH SENSITIVITY)    EKG: None  Radiology: No results found.   Procedures   Medications Ordered in the ED  oxyCODONE-acetaminophen (PERCOCET/ROXICET) 5-325 MG per tablet 1 tablet (has no administration in time range)  ketorolac (TORADOL) 15 MG/ML injection 15 mg (has no administration in time range)  dexamethasone (DECADRON) injection 10 mg (has no administration in time range)                                    Medical Decision Making Amount and/or Complexity of Data Reviewed Labs: ordered. Radiology: ordered.  Risk Prescription drug management.   This patient presents to the ED for concern of neck pain, numbness to left hand differential diagnosis includes radiculopathy, muscle spasm, vertebral  osteomyelitis, epidural abscess    Additional history obtained:  Additional history obtained from family External records from outside source obtained and reviewed including medical records   Lab Tests:  I Ordered, and personally interpreted labs.  The pertinent results include: No leukocytosis, no anemia, normal kidney function liver function, normal electrolytes, negative troponin   Imaging Studies ordered:  I ordered imaging studies including MRI of thoracic and cervical spine I independently visualized and interpreted imaging which showed no acute changes within the thoracic spine,  cervical spine demonstrating foraminal stenosis at C6-C7 with indication for C7 radiculitis I agree with the radiologist interpretation   Medicines ordered and prescription drug management:  I ordered medication including Decadron, Toradol, Percocet for acute pain  Reevaluation of the patient after these medicines showed that the patient improved I have reviewed the patients home medicines and have made adjustments as needed   Problem List / ED Course:  Patient is doing well at this time and is stable for discharge home.  Did discuss patient case with Dr. Christene Sor with neurosurgery who does agree that patient can be discharged home on oral steroids as well as continued pain treatment.  He can follow-up on an outpatient basis regarding the findings on MRI.  He has no clinical indication midline etiology such as CVA or TIA.  Do not suspect underlying cardiac etiology at this point his EKG has no acute ischemic changes and patient has negative troponin.  Symptoms are most consistent with cervical radiculopathy at this point.  Patient has no indication for underlying acute surgical process at this point.  Close follow-up was discussed as well as strict turn precautions for any new or worsening symptoms.  Patient voiced understanding and had no additional questions.   Social Determinants of  Health:  None        Final diagnoses:  None    ED Discharge Orders     None          Daralene Lonni JONETTA DEVONNA 11/15/23 1532    Suzette Pac, MD 11/17/23 [phone]

## 2023-11-18 DIAGNOSIS — G4730 Sleep apnea, unspecified: Secondary | ICD-10-CM | POA: Diagnosis not present

## 2023-11-18 DIAGNOSIS — E291 Testicular hypofunction: Secondary | ICD-10-CM | POA: Diagnosis not present

## 2023-11-26 DIAGNOSIS — M4322 Fusion of spine, cervical region: Secondary | ICD-10-CM | POA: Diagnosis not present

## 2023-11-26 DIAGNOSIS — M5412 Radiculopathy, cervical region: Secondary | ICD-10-CM | POA: Diagnosis not present

## 2023-11-26 DIAGNOSIS — Z6825 Body mass index (BMI) 25.0-25.9, adult: Secondary | ICD-10-CM | POA: Diagnosis not present

## 2023-12-01 DIAGNOSIS — E291 Testicular hypofunction: Secondary | ICD-10-CM | POA: Diagnosis not present

## 2023-12-02 ENCOUNTER — Other Ambulatory Visit: Payer: Self-pay | Admitting: Neurosurgery

## 2023-12-04 ENCOUNTER — Encounter (HOSPITAL_COMMUNITY): Payer: Self-pay

## 2023-12-04 NOTE — Progress Notes (Signed)
 PCP - Dr Norleen Guillaume Cardiologist - none  Chest x-ray - n/a EKG - 11/15/23 Stress Test - yrs ago, unknown location ECHO - n/a Cardiac Cath - n/a  ICD Pacemaker/Loop - n/a  Sleep Study -  Yes CPAP - does not wear CPAP  Diabetes Type 2, does not check blood sugar Do not take Metformin on the morning of surgery.  Mounjaro- hold 7 days prior to procedure.  Last dose will be on 12/07/23  Aspirin & Blood Thinner Instructions:  n/a  NPO  Anesthesia review: Yes  STOP now taking any Aspirin (unless otherwise instructed by your surgeon), Aleve, Naproxen, Ibuprofen, Motrin, Advil, Goody's, BC's, all herbal medications, fish oil, and all vitamins.   Coronavirus Screening Do you have any of the following symptoms:  Cough yes/no: No Fever (>100.52F)  yes/no: No Runny nose yes/no: No Sore throat yes/no: No Difficulty breathing/shortness of breath  yes/no: No  Have you traveled in the last 14 days and where? yes/no: No  Patient verbalized understanding of instructions that were given to them at the PAT appointment. Patient was also instructed that they will need to review over the PAT instructions again at home before surgery.

## 2023-12-04 NOTE — Pre-Procedure Instructions (Signed)
 Surgical Instructions   Your procedure is scheduled on December 17, 2023. Report to [facility] Main Entrance A at 10:30 A.M., then check in with the Admitting office. Any questions or running late day of surgery: call [phone]  Questions prior to your surgery date: call [phone], Monday-Friday, 8am-4pm. If you experience any cold or flu symptoms such as cough, fever, chills, shortness of breath, etc. between now and your scheduled surgery, please notify us  at the above number.     Remember:  Do not eat or drink after midnight the night before your surgery    Take these medicines the morning of surgery with A SIP OF WATER: ALPRAZolam (XANAX)  omeprazole (PRILOSEC)  pravastatin (PRAVACHOL)    May take these medicines IF NEEDED: HYDROcodone -acetaminophen  (NORCO)    STOP taking your Omega-3 Fatty Acids (FISH OIL) one week prior to surgery. DO NOT take any doses after July 22nd.   One week prior to surgery, STOP taking any Aspirin (unless otherwise instructed by your surgeon) Aleve, Naproxen, Ibuprofen, Motrin, Advil, Goody's, BC's, all herbal medications, fish oil, and non-prescription vitamins.   WHAT DO I DO ABOUT MY DIABETES MEDICATION?   Do not take metFORMIN (GLUCOPHAGE) the morning of surgery.  STOP taking your tirzepatide [facility]) one week prior to surgery. DO NOT take any doses after July 22nd.       HOW TO MANAGE YOUR DIABETES BEFORE AND AFTER SURGERY  Why is it important to control my blood sugar before and after surgery? Improving blood sugar levels before and after surgery helps healing and can limit problems. A way of improving blood sugar control is eating a healthy diet by:  Eating less sugar and carbohydrates  Increasing activity/exercise  Talking with your doctor about reaching your blood sugar goals High blood sugars (greater than 180 mg/dL) can raise your risk of infections and slow your recovery, so you will need to focus on controlling your  diabetes during the weeks before surgery. Make sure that the doctor who takes care of your diabetes knows about your planned surgery including the date and location.  How do I manage my blood sugar before surgery? Check your blood sugar at least 4 times a day, starting 2 days before surgery, to make sure that the level is not too high or low.  Check your blood sugar the morning of your surgery when you wake up and every 2 hours until you get to the Short Stay unit.  If your blood sugar is less than 70 mg/dL, you will need to treat for low blood sugar: Do not take insulin. Treat a low blood sugar (less than 70 mg/dL) with  cup of clear juice (cranberry or apple), 4 glucose tablets, OR glucose gel. Recheck blood sugar in 15 minutes after treatment (to make sure it is greater than 70 mg/dL). If your blood sugar is not greater than 70 mg/dL on recheck, call [phone] for further instructions. Report your blood sugar to the short stay nurse when you get to Short Stay.  If you are admitted to the hospital after surgery: Your blood sugar will be checked by the staff and you will probably be given insulin after surgery (instead of oral diabetes medicines) to make sure you have good blood sugar levels. The goal for blood sugar control after surgery is 80-180 mg/dL.                      Do NOT Smoke (Tobacco/Vaping) for 24 hours prior to  your procedure.  If you use a CPAP at night, you may bring your mask/headgear for your overnight stay.   You will be asked to remove any contacts, glasses, piercing's, hearing aid's, dentures/partials prior to surgery. Please bring cases for these items if needed.    Patients discharged the day of surgery will not be allowed to drive home, and someone needs to stay with them for 24 hours.  SURGICAL WAITING ROOM VISITATION Patients may have no more than 2 support people in the waiting area - these visitors may rotate.   Pre-op nurse will coordinate an  appropriate time for 1 ADULT support person, who may not rotate, to accompany patient in pre-op.  Children under the age of 37 must have an adult with them who is not the patient and must remain in the main waiting area with an adult.  If the patient needs to stay at the hospital during part of their recovery, the visitor guidelines for inpatient rooms apply.  Please refer to the [facility] website for the visitor guidelines for any additional information.   If you received a COVID test during your pre-op visit  it is requested that you wear a mask when out in public, stay away from anyone that may not be feeling well and notify your surgeon if you develop symptoms. If you have been in contact with anyone that has tested positive in the last 10 days please notify you surgeon.      Pre-operative 5 CHG Bathing Instructions   You can play a key role in reducing the risk of infection after surgery. Your skin needs to be as free of germs as possible. You can reduce the number of germs on your skin by washing with CHG (chlorhexidine  gluconate) soap before surgery. CHG is an antiseptic soap that kills germs and continues to kill germs even after washing.   DO NOT use if you have an allergy to chlorhexidine /CHG or antibacterial soaps. If your skin becomes reddened or irritated, stop using the CHG and notify one of our RNs at [phone].   Please shower with the CHG soap starting 4 days before surgery using the following schedule:     Please keep in mind the following:  DO NOT shave, including legs and underarms, starting the day of your first shower.   You may shave your face at any point before/day of surgery.  Place clean sheets on your bed the day you start using CHG soap. Use a clean washcloth (not used since being washed) for each shower. DO NOT sleep with pets once you start using the CHG.   CHG Shower Instructions:  Wash your face and private area with normal soap. If you choose to  wash your hair, wash first with your normal shampoo.  After you use shampoo/soap, rinse your hair and body thoroughly to remove shampoo/soap residue.  Turn the water OFF and apply about 3 tablespoons (45 ml) of CHG soap to a CLEAN washcloth.  Apply CHG soap ONLY FROM YOUR NECK DOWN TO YOUR TOES (washing for 3-5 minutes)  DO NOT use CHG soap on face, private areas, open wounds, or sores.  Pay special attention to the area where your surgery is being performed.  If you are having back surgery, having someone wash your back for you may be helpful. Wait 2 minutes after CHG soap is applied, then you may rinse off the CHG soap.  Pat dry with a clean towel  Put on clean clothes/pajamas  If you choose to wear lotion, please use ONLY the CHG-compatible lotions that are listed below.  Additional instructions for the day of surgery: DO NOT APPLY any lotions, deodorants, cologne, or perfumes.   Do not bring valuables to the hospital. [facility] is not responsible for any belongings/valuables. Do not wear nail polish, gel polish, artificial nails, or any other type of covering on natural nails (fingers and toes) Do not wear jewelry or makeup Put on clean/comfortable clothes.  Please brush your teeth.  Ask your nurse before applying any prescription medications to the skin.     CHG Compatible Lotions   Aveeno Moisturizing lotion  Cetaphil Moisturizing Cream  Cetaphil Moisturizing Lotion  Clairol Herbal Essence Moisturizing Lotion, Dry Skin  Clairol Herbal Essence Moisturizing Lotion, Extra Dry Skin  Clairol Herbal Essence Moisturizing Lotion, Normal Skin  Curel Age Defying Therapeutic Moisturizing Lotion with Alpha Hydroxy  Curel Extreme Care Body Lotion  Curel Soothing Hands Moisturizing Hand Lotion  Curel Therapeutic Moisturizing Cream, Fragrance-Free  Curel Therapeutic Moisturizing Lotion, Fragrance-Free  Curel Therapeutic Moisturizing Lotion, Original Formula  Eucerin Daily Replenishing  Lotion  Eucerin Dry Skin Therapy Plus Alpha Hydroxy Crme  Eucerin Dry Skin Therapy Plus Alpha Hydroxy Lotion  Eucerin Original Crme  Eucerin Original Lotion  Eucerin Plus Crme Eucerin Plus Lotion  Eucerin TriLipid Replenishing Lotion  Keri Anti-Bacterial Hand Lotion  Keri Deep Conditioning Original Lotion Dry Skin Formula Softly Scented  Keri Deep Conditioning Original Lotion, Fragrance Free Sensitive Skin Formula  Keri Lotion Fast Absorbing Fragrance Free Sensitive Skin Formula  Keri Lotion Fast Absorbing Softly Scented Dry Skin Formula  Keri Original Lotion  Keri Skin Renewal Lotion Keri Silky Smooth Lotion  Keri Silky Smooth Sensitive Skin Lotion  Nivea Body Creamy Conditioning Oil  Nivea Body Extra Enriched Lotion  Nivea Body Original Lotion  Nivea Body Sheer Moisturizing Lotion Nivea Crme  Nivea Skin Firming Lotion  NutraDerm 30 Skin Lotion  NutraDerm Skin Lotion  NutraDerm Therapeutic Skin Cream  NutraDerm Therapeutic Skin Lotion  ProShield Protective Hand Cream  Provon moisturizing lotion  Please read over the following fact sheets that you were given.

## 2023-12-05 ENCOUNTER — Other Ambulatory Visit: Payer: Self-pay

## 2023-12-05 ENCOUNTER — Encounter (HOSPITAL_COMMUNITY)
Admission: RE | Admit: 2023-12-05 | Discharge: 2023-12-05 | Disposition: A | Discharge status: Home / Self Care | Source: Ambulatory Visit | Attending: Neurosurgery | Admitting: Neurosurgery

## 2023-12-05 ENCOUNTER — Encounter (HOSPITAL_COMMUNITY): Payer: Self-pay

## 2023-12-05 VITALS — BP 121/84 | HR 72 | Temp 98.0°F | Resp 18 | Ht 70.0 in | Wt 171.2 lb

## 2023-12-05 DIAGNOSIS — E119 Type 2 diabetes mellitus without complications: Secondary | ICD-10-CM | POA: Diagnosis not present

## 2023-12-05 DIAGNOSIS — Z79899 Other long term (current) drug therapy: Secondary | ICD-10-CM | POA: Insufficient documentation

## 2023-12-05 DIAGNOSIS — G4733 Obstructive sleep apnea (adult) (pediatric): Secondary | ICD-10-CM | POA: Insufficient documentation

## 2023-12-05 DIAGNOSIS — Z7984 Long term (current) use of oral hypoglycemic drugs: Secondary | ICD-10-CM | POA: Diagnosis not present

## 2023-12-05 DIAGNOSIS — Z01818 Encounter for other preprocedural examination: Secondary | ICD-10-CM | POA: Diagnosis not present

## 2023-12-05 DIAGNOSIS — I10 Essential (primary) hypertension: Secondary | ICD-10-CM | POA: Diagnosis not present

## 2023-12-05 DIAGNOSIS — M5412 Radiculopathy, cervical region: Secondary | ICD-10-CM | POA: Diagnosis not present

## 2023-12-05 DIAGNOSIS — Z7985 Long-term (current) use of injectable non-insulin antidiabetic drugs: Secondary | ICD-10-CM | POA: Insufficient documentation

## 2023-12-05 DIAGNOSIS — Z01812 Encounter for preprocedural laboratory examination: Secondary | ICD-10-CM | POA: Diagnosis not present

## 2023-12-05 DIAGNOSIS — Z981 Arthrodesis status: Secondary | ICD-10-CM | POA: Insufficient documentation

## 2023-12-05 DIAGNOSIS — Z87891 Personal history of nicotine dependence: Secondary | ICD-10-CM | POA: Diagnosis not present

## 2023-12-05 DIAGNOSIS — E291 Testicular hypofunction: Secondary | ICD-10-CM | POA: Diagnosis not present

## 2023-12-05 HISTORY — DX: Other specified abnormal findings of blood chemistry: R79.89

## 2023-12-05 HISTORY — DX: Gastro-esophageal reflux disease without esophagitis: K21.9

## 2023-12-05 LAB — TYPE AND SCREEN
ABO/RH(D): A POS
Antibody Screen: NEGATIVE

## 2023-12-05 LAB — CBC
HCT: 43.6 % (ref 39.0–52.0)
Hemoglobin: 15.2 g/dL (ref 13.0–17.0)
MCH: 32.9 pg (ref 26.0–34.0)
MCHC: 34.9 g/dL (ref 30.0–36.0)
MCV: 94.4 fL (ref 80.0–100.0)
Platelets: 253 K/uL (ref 150–400)
RBC: 4.62 MIL/uL (ref 4.22–5.81)
RDW: 12.6 % (ref 11.5–15.5)
WBC: 6.7 K/uL (ref 4.0–10.5)
nRBC: 0 % (ref 0.0–0.2)

## 2023-12-05 LAB — BASIC METABOLIC PANEL WITH GFR
Anion gap: 10 (ref 5–15)
BUN: 18 mg/dL (ref 6–20)
CO2: 26 mmol/L (ref 22–32)
Calcium: 9.7 mg/dL (ref 8.9–10.3)
Chloride: 102 mmol/L (ref 98–111)
Creatinine, Ser: 1.05 mg/dL (ref 0.61–1.24)
GFR, Estimated: >60 mL/min (ref >60)
Glucose, Bld: 101 mg/dL — ABNORMAL HIGH (ref 70–99)
Potassium: 4.9 mmol/L (ref 3.5–5.1)
Sodium: 138 mmol/L (ref 135–145)

## 2023-12-05 LAB — GLUCOSE, CAPILLARY: Glucose-Capillary: 115 mg/dL — ABNORMAL HIGH (ref 70–99)

## 2023-12-05 LAB — SURGICAL PCR SCREEN
MRSA, PCR: NEGATIVE
Staphylococcus aureus: NEGATIVE

## 2023-12-05 LAB — HEMOGLOBIN A1C
Hgb A1c MFr Bld: 4.8 % (ref 4.8–5.6)
Mean Plasma Glucose: 91.06 mg/dL

## 2023-12-08 NOTE — Progress Notes (Signed)
 Anesthesia Chart Review:  Case: [id number] Date/Time: 12/17/23 1215   Procedure: ANTERIOR CERVICAL DECOMPRESSION/DISCECTOMY FUSION 2 LEVEL/HARDWARE REMOVAL - ACDF C56, C67,REM PREVIOUS PLATE   Anesthesia type: General   Diagnosis: Cervical radiculopathy [M54.12]   Pre-op diagnosis: CERVICAL RADICULOPATHY   [facility] ROOM 20 / [facility] OR   Surgeons: Debby Dorn MATSU, MD       DISCUSSION: Patient is a 58 year old male scheduled for the above procedure.   History includes former smoker (quit 05/20/02), DM2, HTN, OSA (mild by report, does not use CPAP), hypercholesterolemia, GERD, male hypogonadism (on Depo T injections), spinal surgery (C3-5 ACDF 2010).   ED visit 11/15/23 for neck and upper thoracic spinae pain with radiation down left arm with numbness in land hand digits 1-3. He had pending neurosurgery evaluation. He denied chest pain and SOB. Other than low alk phos of 36, his CBC, CMP and Troponin were normal. MRI cervical and thoracic spine findings included leftward disc extrusion at C6-7 resulting in severe proximal left foraminal stenosis, mid spinal stenosis, moderate-severe right foraminal stenosis, query left C7 radiculitis, previously C3-5 ACDF with adjacent segment disease at C5-6 resulting in moderate spinal stenosis with mild spinal cord mass effect and moderate to severe biforaminal stenosis. ED provider did not suspected underlying cardiac etiology, but rather symptoms of cervical radiculopathy.  EKG was also felt to be stable. Results were discussed with covering neurosurgeon Dr. Gerldine Maizes with plan to discharge home on oral steroids and pain medication with neurosurgery follow-up.   EKG from 11/15/23 reviewed and appears stable when compared to tracing from 06/30/15. Reported a remote history of prior stress test.   A1c 4.8%. On metformin and Mounjaro. Last Mounjaro planned for 12/07/23.   Anesthesia team to evaluate on the day of surgery.    VS: BP 121/84   Pulse 72    Temp 36.7 C   Resp 18   Ht 5' 10 (1.778 m)   Wt 77.7 kg   SpO2 100%   BMI 24.56 kg/m    PROVIDERS: [place], Norleen Lenis, MD is PCP (in [place], [place]; phone [phone]) Waldron Pac, NP is endocrinology provider ([facility])   LABS: Labs reviewed: Acceptable for surgery. (all labs ordered are listed, but only abnormal results are displayed)  Labs Reviewed  GLUCOSE, CAPILLARY - Abnormal; Notable for the following components:      Result Value   Glucose-Capillary 115 (*)    All other components within normal limits  BASIC METABOLIC PANEL WITH GFR - Abnormal; Notable for the following components:   Glucose, Bld 101 (*)    All other components within normal limits  SURGICAL PCR SCREEN  CBC  HEMOGLOBIN A1C  TYPE AND SCREEN     IMAGES: MRI C-spine 11/15/23: IMPRESSION: 1. Symptomatic level with regard to Left side radiating pain is favored to be C6-C7, where a leftward disc extrusion results in Severe proximal left foraminal stenosis. Mild spinal stenosis. Moderate to severe right foraminal stenosis. Query left C7 radiculitis.  2. Otherwise Previous C3-C5 ACDF with adjacent segment disease at C5-C6 resulting in multifactorial moderate spinal stenosis with mild spinal cord mass effect, and moderate to severe biforaminal stenosis. No spinal cord signal abnormality. 3. Moderate left foraminal stenosis at the C2-C3 adjacent segment.   MRI T-spine 11/15/23: IMPRESSION: 1. Mildly asymmetric MRI appearance of the pulmonary left lower lobe, might be artifacta. Recommend routine follow-up Chest CT (noncontrast should suffice). 2. Thoracic spine degeneration with no significant spinal or neural foraminal stenosis. Mild degenerative mass  effect on the ventral thoracic spinal cord but no cord signal abnormality.    EKG: EKG 11/15/23: Sinus or ectopic atrial rhythm Low voltage, precordial leads diffuse ST elevations Confirmed by Freddi Hamilton [phone]) on 11/16/2023 7:08:50  AM - EKG appears stable when compared to 06/30/15 tracing.   CV: Reported a remote history of a prior stress test.   Past Medical History:  Diagnosis Date   Diabetes mellitus without complication (HCC)    type 2, does not check blood sugar   GERD (gastroesophageal reflux disease)    Hypercholesterolemia    Hypertension    past medical history ; no longer on medications   Low testosterone  in male    Sleep apnea     slight case don't wear CPAP or Bipap    Past Surgical History:  Procedure Laterality Date   ANKLE ARTHROSCOPY Left    BACK SURGERY     x 2   COLONOSCOPY     x 1   DG GREAT TOE LEFT FOOT     repair/surgery in [place]   MULTIPLE TOOTH EXTRACTIONS     REPAIR EXTENSOR TENDON Left 06/30/2015   Procedure: Repair Extensor Hallucis Longus Left Great Toe;  Surgeon: Jerona Harden GAILS, MD;  Location: [facility] OR;  Service: Orthopedics;  Laterality: Left;    MEDICATIONS:  ALPRAZolam (XANAX) 0.5 MG tablet   Ascorbic Acid (VITAMIN C) 1000 MG tablet   ESZOPICLONE 3 MG tablet   HYDROcodone -acetaminophen  (NORCO) 10-325 MG tablet   metFORMIN (GLUCOPHAGE) 500 MG tablet   Multiple Vitamin (MULTI-VITAMINS) TABS   Omega-3 Fatty Acids (FISH OIL) 1200 MG CAPS   omeprazole (PRILOSEC) 40 MG capsule   pravastatin (PRAVACHOL) 40 MG tablet   SYRINGE-NEEDLE, DISP, 3 ML (B-D 3CC LUER-LOK SYR 22GX1-1/2) 22G X 1-1/2 3 ML MISC   tadalafil (CIALIS) 20 MG tablet   testosterone  cypionate (DEPOTESTOSTERONE CYPIONATE) 200 MG/ML injection   tirzepatide (MOUNJARO) 10 MG/0.5ML Pen   No current facility-administered medications for this encounter.    Isaiah Ruder, PA-C Surgical Short Stay/Anesthesiology [facility] Phone [phone] [facility] Phone [phone] 12/09/2023 2:16 PM

## 2023-12-09 NOTE — Anesthesia Preprocedure Evaluation (Addendum)
 Anesthesia Evaluation  Patient identified by MRN, date of birth, ID band Patient awake    Reviewed: Allergy & Precautions, NPO status , Patient's Chart, lab work & pertinent test results  History of Anesthesia Complications Negative for: history of anesthetic complications  Airway Mallampati: II  TM Distance: >3 FB Neck ROM: Limited    Dental  (+) Dental Advisory Given, Teeth Intact   Pulmonary sleep apnea , former smoker   Pulmonary exam normal        Cardiovascular hypertension (no longer on meds), Normal cardiovascular exam     Neuro/Psych  negative psych ROS   GI/Hepatic ,GERD  Medicated and Controlled,,(+)     substance abuse  alcohol use  Endo/Other  diabetes, Type 2, Oral Hypoglycemic Agents   On GLP-1a   Renal/GU negative Renal ROS     Musculoskeletal negative musculoskeletal ROS (+)    Abdominal   Peds  Hematology negative hematology ROS (+)   Anesthesia Other Findings   Reproductive/Obstetrics                              Anesthesia Physical Anesthesia Plan  ASA: 3  Anesthesia Plan: General   Post-op Pain Management: Tylenol  PO (pre-op)* and Celebrex  PO (pre-op)*   Induction: Intravenous  PONV Risk Score and Plan: 2 and Treatment may vary due to age or medical condition, Ondansetron , Dexamethasone  and Midazolam   Airway Management Planned: Oral ETT and Video Laryngoscope Planned  Additional Equipment: None  Intra-op Plan:   Post-operative Plan: Extubation in OR  Informed Consent: I have reviewed the patients History and Physical, chart, labs and discussed the procedure including the risks, benefits and alternatives for the proposed anesthesia with the patient or authorized representative who has indicated his/her understanding and acceptance.     Dental advisory given  Plan Discussed with: CRNA and Anesthesiologist  Anesthesia Plan Comments: (PAT note  written 12/09/2023 by Allison Zelenak, PA-C.)         Anesthesia Quick Evaluation

## 2023-12-17 ENCOUNTER — Inpatient Hospital Stay (HOSPITAL_COMMUNITY)

## 2023-12-17 ENCOUNTER — Encounter (HOSPITAL_COMMUNITY)
Admission: RE | Disposition: A | Discharge status: Home / Self Care | Payer: Self-pay | Source: Home / Self Care | Attending: Neurosurgery

## 2023-12-17 ENCOUNTER — Inpatient Hospital Stay (HOSPITAL_COMMUNITY): Payer: Self-pay | Admitting: Vascular Surgery

## 2023-12-17 ENCOUNTER — Other Ambulatory Visit: Payer: Self-pay

## 2023-12-17 ENCOUNTER — Encounter (HOSPITAL_COMMUNITY): Payer: Self-pay

## 2023-12-17 ENCOUNTER — Ambulatory Visit (HOSPITAL_COMMUNITY)
Admission: RE | Admit: 2023-12-17 | Discharge: 2023-12-17 | Disposition: A | Discharge status: Home / Self Care | Attending: Neurosurgery | Admitting: Neurosurgery

## 2023-12-17 ENCOUNTER — Inpatient Hospital Stay (HOSPITAL_COMMUNITY): Admitting: Anesthesiology

## 2023-12-17 DIAGNOSIS — Z79899 Other long term (current) drug therapy: Secondary | ICD-10-CM | POA: Insufficient documentation

## 2023-12-17 DIAGNOSIS — I10 Essential (primary) hypertension: Secondary | ICD-10-CM | POA: Diagnosis not present

## 2023-12-17 DIAGNOSIS — Z833 Family history of diabetes mellitus: Secondary | ICD-10-CM | POA: Diagnosis not present

## 2023-12-17 DIAGNOSIS — M4802 Spinal stenosis, cervical region: Secondary | ICD-10-CM | POA: Insufficient documentation

## 2023-12-17 DIAGNOSIS — M2578 Osteophyte, vertebrae: Secondary | ICD-10-CM | POA: Insufficient documentation

## 2023-12-17 DIAGNOSIS — Z87891 Personal history of nicotine dependence: Secondary | ICD-10-CM | POA: Diagnosis not present

## 2023-12-17 DIAGNOSIS — Z7984 Long term (current) use of oral hypoglycemic drugs: Secondary | ICD-10-CM | POA: Insufficient documentation

## 2023-12-17 DIAGNOSIS — M5412 Radiculopathy, cervical region: Secondary | ICD-10-CM | POA: Diagnosis not present

## 2023-12-17 DIAGNOSIS — Z7985 Long-term (current) use of injectable non-insulin antidiabetic drugs: Secondary | ICD-10-CM | POA: Diagnosis not present

## 2023-12-17 DIAGNOSIS — K219 Gastro-esophageal reflux disease without esophagitis: Secondary | ICD-10-CM | POA: Insufficient documentation

## 2023-12-17 DIAGNOSIS — E119 Type 2 diabetes mellitus without complications: Secondary | ICD-10-CM | POA: Insufficient documentation

## 2023-12-17 DIAGNOSIS — M50122 Cervical disc disorder at C5-C6 level with radiculopathy: Secondary | ICD-10-CM | POA: Insufficient documentation

## 2023-12-17 DIAGNOSIS — Z981 Arthrodesis status: Secondary | ICD-10-CM | POA: Diagnosis not present

## 2023-12-17 DIAGNOSIS — M50123 Cervical disc disorder at C6-C7 level with radiculopathy: Secondary | ICD-10-CM | POA: Insufficient documentation

## 2023-12-17 DIAGNOSIS — G4730 Sleep apnea, unspecified: Secondary | ICD-10-CM | POA: Diagnosis not present

## 2023-12-17 HISTORY — PX: ANTERIOR CERVICAL DECOMP/DISCECTOMY FUSION: SHX1161

## 2023-12-17 LAB — GLUCOSE, CAPILLARY
Glucose-Capillary: 76 mg/dL (ref 70–99)
Glucose-Capillary: 61 mg/dL — ABNORMAL LOW (ref 70–99)
Glucose-Capillary: 110 mg/dL — ABNORMAL HIGH (ref 70–99)
Glucose-Capillary: 139 mg/dL — ABNORMAL HIGH (ref 70–99)

## 2023-12-17 LAB — ABO/RH: ABO/RH(D): A POS

## 2023-12-17 SURGERY — ANTERIOR CERVICAL DECOMPRESSION/DISCECTOMY FUSION 2 LEVEL/HARDWARE REMOVAL
Anesthesia: General

## 2023-12-17 MED ORDER — LIDOCAINE 2% (20 MG/ML) 5 ML SYRINGE
INTRAMUSCULAR | Status: DC | PRN
  Administered 2023-12-17: 60 mg via INTRAVENOUS

## 2023-12-17 MED ORDER — FENTANYL CITRATE (PF) 250 MCG/5ML IJ SOLN
INTRAMUSCULAR | Status: DC | PRN
  Administered 2023-12-17: 150 ug via INTRAVENOUS
  Administered 2023-12-17 (×2): 50 ug via INTRAVENOUS

## 2023-12-17 MED ORDER — MIDAZOLAM HCL 2 MG/2ML IJ SOLN
INTRAMUSCULAR | Status: AC
  Filled 2023-12-17: qty 2

## 2023-12-17 MED ORDER — LIDOCAINE-EPINEPHRINE 1 %-1:100000 IJ SOLN
INTRAMUSCULAR | Status: AC
  Filled 2023-12-17: qty 1

## 2023-12-17 MED ORDER — EPHEDRINE SULFATE-NACL 50-0.9 MG/10ML-% IV SOSY
PREFILLED_SYRINGE | INTRAVENOUS | Status: DC | PRN
  Administered 2023-12-17 (×2): 5 mg via INTRAVENOUS

## 2023-12-17 MED ORDER — DEXTROSE 50 % IV SOLN: 12.5000 g | INTRAVENOUS | Status: AC

## 2023-12-17 MED ORDER — PROPOFOL 10 MG/ML IV BOLUS
INTRAVENOUS | Status: DC | PRN
  Administered 2023-12-17: 200 mg via INTRAVENOUS

## 2023-12-17 MED ORDER — PHENYLEPHRINE 80 MCG/ML (10ML) SYRINGE FOR IV PUSH (FOR BLOOD PRESSURE SUPPORT)
PREFILLED_SYRINGE | INTRAVENOUS | Status: AC
  Filled 2023-12-17: qty 10

## 2023-12-17 MED ORDER — DEXAMETHASONE SODIUM PHOSPHATE 10 MG/ML IJ SOLN
INTRAMUSCULAR | Status: DC | PRN
  Administered 2023-12-17: 10 mg via INTRAVENOUS

## 2023-12-17 MED ORDER — LIDOCAINE 2% (20 MG/ML) 5 ML SYRINGE
INTRAMUSCULAR | Status: AC
  Filled 2023-12-17: qty 5

## 2023-12-17 MED ORDER — ROCURONIUM BROMIDE 10 MG/ML (PF) SYRINGE
PREFILLED_SYRINGE | INTRAVENOUS | Status: AC
  Filled 2023-12-17: qty 10

## 2023-12-17 MED ORDER — CHLORHEXIDINE GLUCONATE 0.12 % MT SOLN: 15.0000 mL | Freq: Once | OROMUCOSAL | Status: AC

## 2023-12-17 MED ORDER — ONDANSETRON HCL 4 MG/2ML IJ SOLN
INTRAMUSCULAR | Status: AC
  Filled 2023-12-17: qty 2

## 2023-12-17 MED ORDER — CEFAZOLIN SODIUM-DEXTROSE 2-4 GM/100ML-% IV SOLN
2.0000 g | INTRAVENOUS | Status: AC
  Administered 2023-12-17: 2 g via INTRAVENOUS
  Filled 2023-12-17: qty 100

## 2023-12-17 MED ORDER — DOCUSATE SODIUM 100 MG PO CAPS: 100.0000 mg | ORAL_CAPSULE | Freq: Two times a day (BID) | ORAL | 2 refills | Status: AC | PRN

## 2023-12-17 MED ORDER — CHLORHEXIDINE GLUCONATE CLOTH 2 % EX PADS: 6.0000 | MEDICATED_PAD | Freq: Once | CUTANEOUS | Status: DC

## 2023-12-17 MED ORDER — PROPOFOL 10 MG/ML IV BOLUS
INTRAVENOUS | Status: AC
  Filled 2023-12-17: qty 20

## 2023-12-17 MED ORDER — AMISULPRIDE (ANTIEMETIC) 5 MG/2ML IV SOLN: 10.0000 mg | Freq: Once | INTRAVENOUS | Status: DC | PRN

## 2023-12-17 MED ORDER — CYCLOBENZAPRINE HCL 5 MG PO TABS: 5.0000 mg | ORAL_TABLET | Freq: Three times a day (TID) | ORAL | 2 refills | Status: AC | PRN

## 2023-12-17 MED ORDER — MIDAZOLAM HCL 2 MG/2ML IJ SOLN
INTRAMUSCULAR | Status: DC | PRN
  Administered 2023-12-17: 2 mg via INTRAVENOUS

## 2023-12-17 MED ORDER — FENTANYL CITRATE (PF) 100 MCG/2ML IJ SOLN
25.0000 ug | INTRAMUSCULAR | Status: DC | PRN
  Administered 2023-12-17 (×3): 50 ug via INTRAVENOUS

## 2023-12-17 MED ORDER — ONDANSETRON HCL 4 MG/2ML IJ SOLN
INTRAMUSCULAR | Status: DC | PRN
  Administered 2023-12-17: 4 mg via INTRAVENOUS

## 2023-12-17 MED ORDER — OXYCODONE HCL 5 MG PO TABS: 5.0000 mg | ORAL_TABLET | Freq: Once | ORAL | Status: AC | PRN

## 2023-12-17 MED ORDER — FENTANYL CITRATE (PF) 100 MCG/2ML IJ SOLN
INTRAMUSCULAR | Status: AC
  Filled 2023-12-17: qty 2

## 2023-12-17 MED ORDER — EPHEDRINE 5 MG/ML INJ
INTRAVENOUS | Status: AC
  Filled 2023-12-17: qty 5

## 2023-12-17 MED ORDER — ROCURONIUM BROMIDE 10 MG/ML (PF) SYRINGE
PREFILLED_SYRINGE | INTRAVENOUS | Status: DC | PRN
  Administered 2023-12-17: 50 mg via INTRAVENOUS
  Administered 2023-12-17: 30 mg via INTRAVENOUS
  Administered 2023-12-17: 10 mg via INTRAVENOUS
  Administered 2023-12-17: 20 mg via INTRAVENOUS

## 2023-12-17 MED ORDER — FENTANYL CITRATE (PF) 250 MCG/5ML IJ SOLN
INTRAMUSCULAR | Status: AC
  Filled 2023-12-17: qty 5

## 2023-12-17 MED ORDER — THROMBIN 5000 UNITS EX KIT
PACK | CUTANEOUS | Status: AC
  Filled 2023-12-17: qty 1

## 2023-12-17 MED ORDER — SODIUM CHLORIDE 0.9 % IV SOLN: 12.5000 mg | INTRAVENOUS | Status: DC | PRN

## 2023-12-17 MED ORDER — SUGAMMADEX SODIUM 200 MG/2ML IV SOLN
INTRAVENOUS | Status: DC | PRN
  Administered 2023-12-17: 200 mg via INTRAVENOUS

## 2023-12-17 MED ORDER — THROMBIN 5000 UNITS EX SOLR
OROMUCOSAL | Status: DC | PRN
  Administered 2023-12-17: 5 mL via TOPICAL

## 2023-12-17 MED ORDER — SUGAMMADEX SODIUM 200 MG/2ML IV SOLN
INTRAVENOUS | Status: AC
Start: 2023-12-17 — End: 2023-12-17
  Filled 2023-12-17: qty 2

## 2023-12-17 MED ORDER — PHENYLEPHRINE HCL-NACL 20-0.9 MG/250ML-% IV SOLN
INTRAVENOUS | Status: DC | PRN
  Administered 2023-12-17: 20 ug/min via INTRAVENOUS

## 2023-12-17 MED ORDER — 0.9 % SODIUM CHLORIDE (POUR BTL) OPTIME
TOPICAL | Status: DC | PRN
Start: 2023-12-17 — End: 2023-12-17
  Administered 2023-12-17: 1000 mL

## 2023-12-17 MED ORDER — DEXTROSE 50 % IV SOLN
INTRAVENOUS | Status: AC
  Administered 2023-12-17: 12.5 g via INTRAVENOUS
  Filled 2023-12-17: qty 50

## 2023-12-17 MED ORDER — CELECOXIB 200 MG PO CAPS
200.0000 mg | ORAL_CAPSULE | Freq: Once | ORAL | Status: AC
  Administered 2023-12-17: 200 mg via ORAL
  Filled 2023-12-17: qty 1

## 2023-12-17 MED ORDER — LIDOCAINE-EPINEPHRINE 1 %-1:100000 IJ SOLN
INTRAMUSCULAR | Status: DC | PRN
  Administered 2023-12-17: 2 mL

## 2023-12-17 MED ORDER — OXYCODONE-ACETAMINOPHEN 5-325 MG PO TABS: 1.0000 | ORAL_TABLET | ORAL | 0 refills | Status: AC | PRN

## 2023-12-17 MED ORDER — BUPIVACAINE HCL (PF) 0.5 % IJ SOLN
INTRAMUSCULAR | Status: AC
  Filled 2023-12-17: qty 30

## 2023-12-17 MED ORDER — OXYCODONE HCL 5 MG/5ML PO SOLN
ORAL | Status: AC
  Filled 2023-12-17: qty 5

## 2023-12-17 MED ORDER — PHENYLEPHRINE 80 MCG/ML (10ML) SYRINGE FOR IV PUSH (FOR BLOOD PRESSURE SUPPORT)
PREFILLED_SYRINGE | INTRAVENOUS | Status: DC | PRN
  Administered 2023-12-17: 160 ug via INTRAVENOUS
  Administered 2023-12-17 (×3): 80 ug via INTRAVENOUS

## 2023-12-17 MED ORDER — ACETAMINOPHEN 500 MG PO TABS
1000.0000 mg | ORAL_TABLET | Freq: Once | ORAL | Status: AC
  Administered 2023-12-17: 1000 mg via ORAL
  Filled 2023-12-17: qty 2

## 2023-12-17 MED ORDER — ORAL CARE MOUTH RINSE: 15.0000 mL | Freq: Once | OROMUCOSAL | Status: AC

## 2023-12-17 MED ORDER — LACTATED RINGERS IV SOLN: INTRAVENOUS | Status: DC

## 2023-12-17 MED ORDER — DEXAMETHASONE SODIUM PHOSPHATE 10 MG/ML IJ SOLN
INTRAMUSCULAR | Status: AC
  Filled 2023-12-17: qty 1

## 2023-12-17 MED ORDER — BUPIVACAINE HCL (PF) 0.5 % IJ SOLN
INTRAMUSCULAR | Status: DC | PRN
  Administered 2023-12-17: 2 mL

## 2023-12-17 MED ORDER — LACTATED RINGERS IV SOLN: INTRAVENOUS | Status: DC | PRN

## 2023-12-17 MED ORDER — OXYCODONE HCL 5 MG/5ML PO SOLN
5.0000 mg | Freq: Once | ORAL | Status: AC | PRN
  Administered 2023-12-17: 5 mg via ORAL

## 2023-12-17 MED ORDER — CHLORHEXIDINE GLUCONATE 0.12 % MT SOLN
OROMUCOSAL | Status: AC
  Administered 2023-12-17: 15 mL via OROMUCOSAL
  Filled 2023-12-17: qty 15

## 2023-12-17 SURGICAL SUPPLY — 55 items
BAG COUNTER SPONGE SURGICOUNT (BAG) ×1 IMPLANT
BAND RUBBER #18 3X1/16 STRL (MISCELLANEOUS) ×2 IMPLANT
BASKET BONE COLLECTION (BASKET) IMPLANT
BENZOIN TINCTURE PRP APPL 2/3 (GAUZE/BANDAGES/DRESSINGS) ×1 IMPLANT
BIT DRILL NEURO 2X3.1 SFT TUCH (MISCELLANEOUS) ×1 IMPLANT
BLADE CLIPPER SURG (BLADE) IMPLANT
BUR MATCHSTICK NEURO 3.0 LAGG (BURR) ×1 IMPLANT
CANISTER SUCTION 3000ML PPV (SUCTIONS) ×1 IMPLANT
DEVICE EDNSKLTN TC NNLCK MED 8 (Cage) IMPLANT
DEVICE ENDSKLTN IMPL 16X14X7X6 (Cage) IMPLANT
DRAPE C-ARM 42X72 X-RAY (DRAPES) ×2 IMPLANT
DRAPE HALF SHEET 40X57 (DRAPES) IMPLANT
DRAPE LAPAROTOMY 100X72 PEDS (DRAPES) ×1 IMPLANT
DRAPE MICROSCOPE SLANT 54X150 (MISCELLANEOUS) ×1 IMPLANT
DRESSING MEPILEX FLEX 4X4 (GAUZE/BANDAGES/DRESSINGS) ×1 IMPLANT
DRSG OPSITE 4X5.5 SM (GAUZE/BANDAGES/DRESSINGS) ×2 IMPLANT
DRSG OPSITE POSTOP 3X4 (GAUZE/BANDAGES/DRESSINGS) IMPLANT
DURAPREP 26ML APPLICATOR (WOUND CARE) ×1 IMPLANT
ELECT COATED BLADE 2.86 ST (ELECTRODE) ×1 IMPLANT
ELECTRODE REM PT RTRN 9FT ADLT (ELECTROSURGICAL) ×1 IMPLANT
GAUZE 4X4 16PLY ~~LOC~~+RFID DBL (SPONGE) IMPLANT
GLOVE BIO SURGEON STRL SZ7 (GLOVE) ×1 IMPLANT
GLOVE BIOGEL PI IND STRL 7.5 (GLOVE) ×1 IMPLANT
GLOVE BIOGEL PI IND STRL 8 (GLOVE) ×3 IMPLANT
GLOVE ECLIPSE 8.0 STRL XLNG CF (GLOVE) ×1 IMPLANT
GOWN STRL REUS W/ TWL LRG LVL3 (GOWN DISPOSABLE) ×1 IMPLANT
GOWN STRL REUS W/ TWL XL LVL3 (GOWN DISPOSABLE) ×1 IMPLANT
GOWN STRL REUS W/TWL 2XL LVL3 (GOWN DISPOSABLE) IMPLANT
HEMOSTAT POWDER KIT SURGIFOAM (HEMOSTASIS) ×1 IMPLANT
KIT BASIN OR (CUSTOM PROCEDURE TRAY) ×1 IMPLANT
KIT TURNOVER KIT B (KITS) ×1 IMPLANT
NDL HYPO 22X1.5 SAFETY MO (MISCELLANEOUS) ×1 IMPLANT
NDL SPNL 22GX3.5 QUINCKE BK (NEEDLE) ×1 IMPLANT
NEEDLE HYPO 22X1.5 SAFETY MO (MISCELLANEOUS) ×1 IMPLANT
NEEDLE SPNL 22GX3.5 QUINCKE BK (NEEDLE) ×1 IMPLANT
NS IRRIG 1000ML POUR BTL (IV SOLUTION) ×1 IMPLANT
PACK LAMINECTOMY NEURO (CUSTOM PROCEDURE TRAY) ×1 IMPLANT
PAD ARMBOARD POSITIONER FOAM (MISCELLANEOUS) ×3 IMPLANT
PIN DISTRACTION 14MM (PIN) IMPLANT
PIN DISTRACTION TAP 12 DISP (PIN) IMPLANT
PLATE 39MM (Plate) IMPLANT
PUTTY DBF 3CC CORTICAL FIBERS (Putty) IMPLANT
SCREW 3.5 SELFDRILL 17MM VARI (Screw) IMPLANT
SPIKE FLUID TRANSFER (MISCELLANEOUS) ×2 IMPLANT
SPONGE INTESTINAL PEANUT (DISPOSABLE) ×1 IMPLANT
STAPLER SKIN PROX 35W (STAPLE) IMPLANT
STRIP CLOSURE SKIN 1/2X4 (GAUZE/BANDAGES/DRESSINGS) ×1 IMPLANT
SUT MNCRL AB 4-0 PS2 18 (SUTURE) ×1 IMPLANT
SUT SILK 2 0 TIES 10X30 (SUTURE) IMPLANT
SUT VIC AB 3-0 SH 8-18 (SUTURE) ×1 IMPLANT
TAPE CLOTH 3X10 TAN LF (GAUZE/BANDAGES/DRESSINGS) ×1 IMPLANT
TIP KERRISON THIN FOOTPLATE 2M (MISCELLANEOUS) ×1 IMPLANT
TOWEL GREEN STERILE (TOWEL DISPOSABLE) ×1 IMPLANT
TOWEL GREEN STERILE FF (TOWEL DISPOSABLE) ×1 IMPLANT
WATER STERILE IRR 1000ML POUR (IV SOLUTION) ×1 IMPLANT

## 2023-12-17 NOTE — Transfer of Care (Signed)
 Immediate Anesthesia Transfer of Care Note  Patient: Tanner Martinez  Procedure(s) Performed: ANTERIOR CERVICAL DECOMPRESSION/DISCECTOMY FUSION CERVICAL FIVE-CERVICAL SIX, CERVICAL SIX-CERVICAL SEVEN, REMOVAL OF PREVIOUS ANTERIOR CERVICAL PLATE  Patient Location: PACU  Anesthesia Type:General  Level of Consciousness: drowsy and patient cooperative  Airway & Oxygen Therapy: Patient connected to face mask oxygen  Post-op Assessment: Report given to RN and Post -op Vital signs reviewed and stable  Post vital signs: Reviewed and stable  Last Vitals:  Vitals Value Taken Time  BP 120/75 1638  Temp    Pulse 88 12/17/23 16:38  Resp 23 12/17/23 16:38  SpO2 99 % 12/17/23 16:38  Vitals shown include unfiled device data.  Last Pain:  Vitals:   12/17/23 1049  TempSrc:   PainSc: 6          Complications: There were no known notable events for this encounter.

## 2023-12-17 NOTE — H&P (Signed)
 CC: neck pain, left arm weakness  HPI:     Patient is a 58 y.o. male with hx of C3-5 ACDF developed severe left arm pain and weakess, found to have severe cervical stenosis.    Patient Active Problem List   Diagnosis Date Noted   Sleep apnea 03/08/2021   Low testosterone  03/08/2021   Left ankle pain 04/18/2015   Plantar fasciitis of right foot 10/30/2012   Past Medical History:  Diagnosis Date   Diabetes mellitus without complication (HCC)    type 2, does not check blood sugar   GERD (gastroesophageal reflux disease)    Hypercholesterolemia    Hypertension    past medical history ; no longer on medications   Low testosterone  in male    Sleep apnea     slight case don't wear CPAP or Bipap    Past Surgical History:  Procedure Laterality Date   ANKLE ARTHROSCOPY Left    BACK SURGERY     x 2   COLONOSCOPY     x 1   DG GREAT TOE LEFT FOOT     repair/surgery in [place]   MULTIPLE TOOTH EXTRACTIONS     REPAIR EXTENSOR TENDON Left 06/30/2015   Procedure: Repair Extensor Hallucis Longus Left Great Toe;  Surgeon: Jerona Harden GAILS, MD;  Location: [facility] OR;  Service: Orthopedics;  Laterality: Left;    Medications Prior to Admission  Medication Sig Dispense Refill Last Dose/Taking   ALPRAZolam (XANAX) 0.5 MG tablet Take 0.5 mg by mouth 3 (three) times daily.   12/16/2023   Ascorbic Acid (VITAMIN C) 1000 MG tablet Take 1,000 mg by mouth daily.   Past Week   ESZOPICLONE 3 MG tablet Take 3 mg by mouth at bedtime.    12/16/2023   HYDROcodone -acetaminophen  (NORCO) 10-325 MG tablet Take 1 tablet by mouth every 6 (six) hours as needed for severe pain (pain score 7-10).   Past Week   metFORMIN (GLUCOPHAGE) 500 MG tablet Take 500 mg by mouth 2 (two) times daily.   12/16/2023   Multiple Vitamin (MULTI-VITAMINS) TABS Take 2 each by mouth daily.   Past Week   Omega-3 Fatty Acids (FISH OIL) 1200 MG CAPS Take 1,200 mg by mouth daily.   Past Week   omeprazole (PRILOSEC) 40 MG capsule Take 40 mg by mouth  daily.   12/16/2023   pravastatin (PRAVACHOL) 40 MG tablet Take 40 mg by mouth daily.   12/16/2023   tadalafil (CIALIS) 20 MG tablet Take 20 mg by mouth daily as needed for erectile dysfunction.   Past Week   testosterone  cypionate (DEPOTESTOSTERONE CYPIONATE) 200 MG/ML injection Inject 0.5 mLs (100 mg total) into the muscle every 14 (fourteen) days. (Patient taking differently: Inject 100 mg into the muscle every 7 (seven) days.) 6 mL 1 12/13/2023   tirzepatide (MOUNJARO) 10 MG/0.5ML Pen Inject 10 mg into the skin once a week. Sunday   12/10/2023   SYRINGE-NEEDLE, DISP, 3 ML (B-D 3CC LUER-LOK SYR 22GX1-1/2) 22G X 1-1/2 3 ML MISC 1 Device by Does not apply route every 14 (fourteen) days. 10 each 2    No Known Allergies  Social History   Tobacco Use   Smoking status: Former    Current packs/day: 0.00    Types: Cigarettes    Quit date: 05/20/2002    Years since quitting: 21.5   Smokeless tobacco: Former    Types: Chew    Quit date: 05/20/2006  Substance Use Topics   Alcohol use: Yes  Alcohol/week: 10.0 standard drinks of alcohol    Types: 10 Standard drinks or equivalent per week    Comment: 1    Family History  Problem Relation Age of Onset   Diabetes Mother    Diabetes Sister      Review of Systems Pertinent items are noted in HPI.  Objective:   Patient Vitals for the past 8 hrs:  BP Temp Temp src Pulse Resp SpO2 Height Weight  12/17/23 1018 113/78 97.8 F (36.6 C) Oral 80 18 99 % 5' 10 (1.778 m) 74.8 kg   No intake/output data recorded. No intake/output data recorded.      General : Alert, cooperative, no distress, appears stated age   Head:  Normocephalic/atraumatic    Eyes: PERRL, conjunctiva/corneas clear, EOM's intact. Fundi could not be visualized Neck: Supple Chest:  Respirations unlabored Chest wall: no tenderness or deformity Heart: Regular rate and rhythm Abdomen: Soft, nontender and nondistended Extremities: warm and well-perfused Skin: normal turgor,  color and texture Neurologic:  Alert, oriented x 3.  Eyes open spontaneously. PERRL, EOMI, VFC, no facial droop. V1-3 intact.  No dysarthria, tongue protrusion symmetric.  CNII-XII intact. Normal strength, sensation and reflexes throughout except 4/5 L D, 4/5 B, 4-/5 L triceps, 4/5 L WExt  No pronator drift, full strength in legs       Data ReviewCBC:  Lab Results  Component Value Date   WBC 6.7 12/05/2023   RBC 4.62 12/05/2023   BMP:  Lab Results  Component Value Date   GLUCOSE 101 (H) 12/05/2023   CO2 26 12/05/2023   BUN 18 12/05/2023   CREATININE 1.05 12/05/2023   CALCIUM 9.7 12/05/2023   Radiology review:  See clinic note  Assessment:   Active Problems:   * No active hospital problems. *  Cervical stenosis with severe radiculopathy  Plan:   - Risks, benefits, alternatives, and expected convalescence were discussed with the patient.  Risks discussed included, but were not limited to bleeding, pain, infection, dysphagia, dysphonia, scar, spinal fluid leak, neurologic deficit, instability, pseudoarthrosis, adjacent segment disease, damage to nearby organs, and death.  Informed consent was obtained.

## 2023-12-17 NOTE — Anesthesia Procedure Notes (Signed)
 Procedure Name: Intubation Date/Time: 12/17/2023 1:23 PM  Performed by: Parley Prentice PARAS, CRNAPre-anesthesia Checklist: Patient identified, Emergency Drugs available, Suction available and Patient being monitored Patient Re-evaluated:Patient Re-evaluated prior to induction Oxygen Delivery Method: Circle System Utilized Preoxygenation: Pre-oxygenation with 100% oxygen Induction Type: IV induction Ventilation: Mask ventilation without difficulty Laryngoscope Size: Glidescope and 3 Grade View: Grade I Tube type: Oral Tube size: 7.5 mm Number of attempts: 1 Airway Equipment and Method: Stylet and Oral airway Placement Confirmation: ETT inserted through vocal cords under direct vision, positive ETCO2 and breath sounds checked- equal and bilateral Secured at: 21 cm Tube secured with: Tape Dental Injury: Teeth and Oropharynx as per pre-operative assessment

## 2023-12-17 NOTE — Op Note (Signed)
 PREOP DIAGNOSIS: Cervical radiculopathy  POSTOP DIAGNOSIS: Cervical radiculopathy   PROCEDURE: 1. Arthrodesis C5-6, anterior interbody technique, including Discectomy for decompression of spinal cord and exiting nerve roots with foraminotomies  2. Arthrodesis, additional level C6-7 anterior interbody technique, including Discectomy for decompression of spinal cord and exiting nerve roots with foraminotomies  3. Placement of intervertebral biomechanical device C5-6 4. Placement of intervertebral biomechanical device C6-7 5. Placement of anterior instrumentation consisting of interbody plate and screws C5-C6-C7 6. Use of morselized bone allograft  7. Use of intraoperative microscope  SURGEON: Dr. Dorn Ned, MD  ASSISTANT: Camie Pickle, PA.  Please note, no qualified trainees were available to assist with the procedure.  Assistance was required for retraction of the visceral structures to safely allow for instrumentation.  ANESTHESIA: General Endotracheal  EBL: 25 mL  IMPLANTS: Medtronic 7 mm Titan C cage at C5-6, medium 8 mm Titan C cage at C6-7, medium Zevo plate 16 mm screws x 6  SPECIMENS: None  DRAINS: None  COMPLICATIONS: None immediate  CONDITION: Hemodynamically stable to PACU  HISTORY: This is a 58 year old man with history of C3-5 ACDF who developed progressively worsening neck pain with radiation down his left arm.  He is developing weakness mainly affecting his left triceps but noticed some weakness of bicep and wrist extension as well.  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  Medicaland physical therapy failed to improve his symptoms.  As such, I discussed with him the option of C5-6 and C6-7 ACDF.SABRA  Risks, benefits, alternatives, and expected convalescence were discussed with the patient.  Risks discussed included but were not  limited to bleeding, pain, infection, dysphagia, dysphonia, pseudoarthrosis, hardware failure, adjacent segment disease, CSF leak, neurologic deficits, weakness, numbness, paralysis, coma, and death. After all questions were answered, informed consent was obtained.  PROCEDURE IN DETAIL: The patient was brought to the operating room and transferred to the operative table. After induction of general anesthesia, the patient was positioned on the operative table in the supine position with all pressure points meticulously padded. The skin of the neck was then prepped and draped in the usual sterile fashion.  After timeout was conducted, the skin was infiltrated with local anesthetic. Skin incision was then made sharply and Bovie electrocautery was used to dissect the subcutaneous tissue until the platysma was identified. The platysma was then divided and undermined. The sternocleidomastoid muscle was then identified and, utilizing natural fascial planes in the neck, the prevertebral fascia was identified and the carotid sheath was retracted laterally and the trachea and esophagus retracted medially.  Scar over the inferior aspect of the previous cervical plate was incised and the inferior portion of the cervical plate exposed.  Large osteophyte at C5-6 was identified.  Again using fluoroscopy, the C5-6 disc space was identified. Bovie electrocautery was used to dissect in the subperiosteal plane and elevate the bilateral longus coli muscles. Self-retaining retractors were then placed. Caspar distraction pins were placed in the adjacent bodies to allow for gentle distraction.  At this point, the microscope was draped and brought into the field, and the remainder of the case was done under the microscope using microdissecting technique.  The disc space was incised sharply and combination of high speed drill, curettes, and rongeurs were use to initially complete a discectomy. The high-speed drill was then used to  complete discectomy until the posterior  annulus was identified and removed and the posterior longitudinal ligament was identified. Using a nerve hook, the PLL was elevated, and Kerrison rongeurs were used to remove the posterior longitudinal ligament and the ventral thecal sac was identified. Using a combination of curettes and rongeurs, complete decompression of the thecal sac and exiting nerve roots at this level was completed, and verified with easy passage of micro-nerve hook centrally and in the bilateral foramina.  Having completed our decompression, attention was turned to placement of the intervertebral device. Trial spacers were used to select a size 7 mm graft. This graft was then filled with morcellized allograft, and inserted under live fluoroscopy.  Attention was then turned to the C6-7 level. Caspar distraction pin was placed in the adjacent body to allow for gentle distraction of the disc space.  In a similar fashion, discectomy was completed initially with curettes and rongeurs, and completed with the drill. The PLL was again identified, elevated and incised. Using Kerrison rongeurs, decompression of the spinal cord and exiting roots was completed and confirmed with a dissector, with special attention paid to the left C6-7 foramen where disc fragments were removed. Trial spacers were used to select a 8 mm graft. This graft was then filled with morcellized allograft, and inserted under live fluoroscopy.   After placement of the intervertebral devices, the caspar pins were removed.  An anterior cervical plate was placed across the interspaces for anterior fixation.  Using a high-speed drill, the cortex of the cervical vertebral bodies was punctured, and screws inserted in the vertebral bodies. Final fluoroscopic images in AP and lateral projections were taken to confirm good hardware placement.  At this point, after all counts were verified to be correct, meticulous hemostasis was secured  using a combination of bipolar electrocautery and passive hemostatics. The platysma muscle was then closed using interrupted 3-0 Vicryl sutures, and the skin was closed with a 4-0 monocryl in subcutical fashion. Sterile dressings were then applied and the drapes removed.  The patient tolerated the procedure well and was extubated in the room and taken to the postanesthesia care unit in stable condition.  All counts were correct at the end of the procedure.

## 2023-12-18 ENCOUNTER — Encounter (HOSPITAL_COMMUNITY): Payer: Self-pay | Admitting: Neurosurgery

## 2023-12-18 NOTE — Anesthesia Postprocedure Evaluation (Signed)
 Anesthesia Post Note  Patient: Tanner Martinez  Procedure(s) Performed: ANTERIOR CERVICAL DECOMPRESSION/DISCECTOMY FUSION CERVICAL FIVE-CERVICAL SIX, CERVICAL SIX-CERVICAL SEVEN, REMOVAL OF PREVIOUS ANTERIOR CERVICAL PLATE     Patient location during evaluation: PACU Anesthesia Type: General Level of consciousness: awake and alert Pain management: pain level controlled Vital Signs Assessment: post-procedure vital signs reviewed and stable Respiratory status: spontaneous breathing, nonlabored ventilation and respiratory function stable Cardiovascular status: stable and blood pressure returned to baseline Anesthetic complications: no   There were no known notable events for this encounter.  Last Vitals:  Vitals:   12/17/23 1815 12/17/23 1830  BP: 112/75 111/72  Pulse: 75 84  Resp: 14 12  Temp:  36.4 C  SpO2: 97% 94%    Last Pain:  Vitals:   12/17/23 1800  TempSrc:   PainSc: Asleep   Pain Goal: Patients Stated Pain Goal: 3 (12/17/23 1730)                 Debby FORBES Like

## 2024-02-11 DIAGNOSIS — M5412 Radiculopathy, cervical region: Secondary | ICD-10-CM | POA: Diagnosis not present

## 2024-06-16 ENCOUNTER — Other Ambulatory Visit: Payer: Self-pay

## 2024-06-16 ENCOUNTER — Encounter: Payer: Self-pay | Admitting: Neurology

## 2024-06-16 DIAGNOSIS — R202 Paresthesia of skin: Secondary | ICD-10-CM

## 2024-08-12 ENCOUNTER — Encounter: Payer: Self-pay | Admitting: Neurology
